# Patient Record
Sex: Male | Born: 1975 | Race: White | Hispanic: No | Smoking: Current some day smoker
Health system: Southern US, Community
[De-identification: ages and names within clinical notes are randomized; demographics above are authoritative.]

## PROBLEM LIST (undated history)

## (undated) DIAGNOSIS — Z98811 Dental restoration status: Secondary | ICD-10-CM

## (undated) DIAGNOSIS — S86019A Strain of unspecified Achilles tendon, initial encounter: Secondary | ICD-10-CM

## (undated) HISTORY — PX: NO PAST SURGERIES: SHX2092

---

## 2016-09-20 DIAGNOSIS — S86019A Strain of unspecified Achilles tendon, initial encounter: Secondary | ICD-10-CM

## 2016-09-20 HISTORY — DX: Strain of unspecified achilles tendon, initial encounter: S86.019A

## 2016-10-17 ENCOUNTER — Emergency Department (HOSPITAL_COMMUNITY): Admission: EM | Admit: 2016-10-17 | Discharge: 2016-10-17 | Discharge status: Against Medical Advice | Payer: Self-pay

## 2016-10-17 ENCOUNTER — Emergency Department (HOSPITAL_BASED_OUTPATIENT_CLINIC_OR_DEPARTMENT_OTHER)
Admission: EM | Admit: 2016-10-17 | Discharge: 2016-10-17 | Disposition: A | Discharge status: Home / Self Care | Payer: 59 | Attending: Emergency Medicine | Admitting: Emergency Medicine

## 2016-10-17 ENCOUNTER — Emergency Department (HOSPITAL_BASED_OUTPATIENT_CLINIC_OR_DEPARTMENT_OTHER): Payer: 59

## 2016-10-17 ENCOUNTER — Encounter (HOSPITAL_BASED_OUTPATIENT_CLINIC_OR_DEPARTMENT_OTHER): Payer: Self-pay | Admitting: *Deleted

## 2016-10-17 DIAGNOSIS — S86011A Strain of right Achilles tendon, initial encounter: Secondary | ICD-10-CM

## 2016-10-17 DIAGNOSIS — F17200 Nicotine dependence, unspecified, uncomplicated: Secondary | ICD-10-CM | POA: Diagnosis not present

## 2016-10-17 DIAGNOSIS — X500XXA Overexertion from strenuous movement or load, initial encounter: Secondary | ICD-10-CM | POA: Insufficient documentation

## 2016-10-17 DIAGNOSIS — Y92322 Soccer field as the place of occurrence of the external cause: Secondary | ICD-10-CM | POA: Diagnosis not present

## 2016-10-17 DIAGNOSIS — Y999 Unspecified external cause status: Secondary | ICD-10-CM | POA: Insufficient documentation

## 2016-10-17 DIAGNOSIS — S86021A Laceration of right Achilles tendon, initial encounter: Secondary | ICD-10-CM | POA: Insufficient documentation

## 2016-10-17 DIAGNOSIS — S8991XA Unspecified injury of right lower leg, initial encounter: Secondary | ICD-10-CM | POA: Diagnosis present

## 2016-10-17 DIAGNOSIS — Y9366 Activity, soccer: Secondary | ICD-10-CM | POA: Insufficient documentation

## 2016-10-17 MED ORDER — TRAMADOL HCL 50 MG PO TABS: 50.0000 mg | ORAL_TABLET | Freq: Four times a day (QID) | ORAL | 0 refills | Status: DC | PRN

## 2016-10-17 MED ORDER — HYDROCODONE-ACETAMINOPHEN 5-325 MG PO TABS
1.0000 | ORAL_TABLET | Freq: Once | ORAL | Status: AC
  Administered 2016-10-17: 1 via ORAL
  Filled 2016-10-17: qty 1

## 2016-10-17 NOTE — ED Provider Notes (Signed)
[facility] Provider Note   CSN: [id number] Arrival date & time: 10/17/16  2031     History   Chief Complaint No chief complaint on file.   HPI Jeremy Nielsen is a 41 y.o. male who presents emergency department today for right lower leg pain. Patient was playing soccer when beginning to sprint he felt a pop in the back of his right heel stating "it felt like somebody hit me in the back of the leg". He noted immediate pain to the area with difficulty with ambulation and movement of the ankle. Not taking anything for this. Nothing makes this better. No prior trauma or injury to the ankle in past. He denies numbness, tingling of the lower extremity.  HPI  History reviewed. No pertinent past medical history.  There are no active problems to display for this patient.   History reviewed. No pertinent surgical history.     Home Medications    Prior to Admission medications   Medication Sig Start Date End Date Taking? Authorizing Provider  traMADol (ULTRAM) 50 MG tablet Take 1 tablet (50 mg total) by mouth every 6 (six) hours as needed. 10/17/16   Drayden Lukas, Elmer Sow, PA-C    Family History No family history on file.  Social History Social History  Substance Use Topics  . Smoking status: Current Some Day Smoker  . Smokeless tobacco: Never Used  . Alcohol use Yes     Allergies   Patient has no known allergies.   Review of Systems Review of Systems  Musculoskeletal: Positive for arthralgias and myalgias. Negative for joint swelling.  Skin: Negative for wound.  Neurological: Negative for weakness and numbness.     Physical Exam Updated Vital Signs BP (!) 131/98   Pulse 82   Temp 98.4 F (36.9 C) (Oral)   Resp 20   Ht 5\' 6"  (1.676 m)   Wt 86.2 kg (190 lb)   SpO2 96%   BMI 30.67 kg/m   Physical Exam  Constitutional: He appears well-developed and well-nourished.  HENT:  Head: Normocephalic and atraumatic.  Right Ear: External ear normal.    Left Ear: External ear normal.  Eyes: Conjunctivae are normal. Right eye exhibits no discharge. Left eye exhibits no discharge. No scleral icterus.  Pulmonary/Chest: Effort normal. No respiratory distress.  Musculoskeletal:       Right knee: Normal. He exhibits normal range of motion. No tenderness found.       Right ankle: Decreased range of motion: intact passive range of motion. No tenderness. Achilles tendon exhibits pain, defect and abnormal Thompson's test results.       Right lower leg: He exhibits no tenderness.  Palpable defect of the Achilles tendon approximately 4 cm proximal to the calcaneus. Neurovascularly intact distally. Compartments soft   Neurological: He is alert.  Skin: No pallor.  Psychiatric: He has a normal mood and affect.  Nursing note and vitals reviewed.   ED Treatments / Results  Labs (all labs ordered are listed, but only abnormal results are displayed) Labs Reviewed - No data to display  EKG  EKG Interpretation None       Radiology Dg Ankle Complete Right  Result Date: 10/17/2016 CLINICAL DATA:  Pain after soccer injury while running today. EXAM: RIGHT ANKLE - COMPLETE 3+ VIEW COMPARISON:  None. FINDINGS: Negative for acute fracture or dislocation. There is soft tissue thickening in the Achilles region. There is mild ossification in the region of the tibiofibular syndesmosis which likely represents residua of remote  trauma. There also is a chronic appearing fragment at the medial malleolar tip. The mortise is symmetric. IMPRESSION: Negative for acute fracture. Mild soft tissue swelling in the Achilles region. Electronically Signed   By: Ellery Plunk M.D.   On: 10/17/2016 21:24    Procedures Procedures (including critical care time)  Medications Ordered in ED Medications  HYDROcodone-acetaminophen (NORCO/VICODIN) 5-325 MG per tablet 1 tablet (1 tablet Oral Given 10/17/16 2223)     Initial Impression / Assessment and Plan / ED Course  I have  reviewed the triage vital signs and the nursing notes.  Pertinent labs & imaging results that were available during my care of the patient were reviewed by me and considered in my medical decision making (see chart for details).     41 year old male with Achilles tendon rupture on exam. Patient is with a normal Thompson test and a palpable defect of the Achilles tendon approximately 4 cm proximal to the calcaneus. On x-ray there is noted mild tissue swelling in the Achilles region. No fracture. Patient will be placed into a splint and given crutches. Patient is to follow with orthopedics to discuss further treatment for this. Advised RICE therapy. Short course of pain medication given.  Patient reviewed in [place] Controlled Substance Reporting System. Pain managed in the department. Specific return precautions discussed. The patient verbalized understanding and agreement with plan. All questions answered. No further questions at this time. The patient is hemodynamically stable, mentating appropriately and appears safe for discharge.  Final Clinical Impressions(s) / ED Diagnoses   Final diagnoses:  Rupture of right Achilles tendon, initial encounter    New Prescriptions Discharge Medication List as of 10/17/2016 10:39 PM    START taking these medications   Details  traMADol (ULTRAM) 50 MG tablet Take 1 tablet (50 mg total) by mouth every 6 (six) hours as needed., Starting Tue 10/17/2016, Print         Jacinto Halim, PA-C 10/18/16 0143    Melene Plan, DO 10/18/16 1747

## 2016-10-17 NOTE — Discharge Instructions (Signed)
You have been diagnosed with an Achilles tendon rupture today. I provided you with a splint and crutches. Please remain nonweightbearing until you follow with orthopedics to discuss if surgical treatment is needed for this. Please call and schedule an appointment with the orthopedist. For pain control you may take:  800mg  of ibuprofen (that is usually 4 over the counter pills)  3 times a day (take with food) and acetaminophen 975mg  (this is 3 over the counter pills) four times a day. Do not drink alcohol or combine with other medications that have acetaminophen as an ingredient (Read the labels!).  For breakthrough pain you may take tramadol. Do not drink alcohol drive or operate heavy machinery when taking tramadol. If you develop worsening or new concerning symptoms you can return to the emergency department for re-evaluation.

## 2016-10-17 NOTE — ED Triage Notes (Signed)
Right leg pain. He was playing soccer and felt a snap while running. Swelling to his heel.

## 2016-10-18 ENCOUNTER — Encounter (INDEPENDENT_AMBULATORY_CARE_PROVIDER_SITE_OTHER): Payer: Self-pay | Admitting: Orthopaedic Surgery

## 2016-10-18 ENCOUNTER — Ambulatory Visit (INDEPENDENT_AMBULATORY_CARE_PROVIDER_SITE_OTHER): Payer: 59 | Admitting: Orthopaedic Surgery

## 2016-10-18 VITALS — BP 122/76 | HR 75 | Ht 66.0 in | Wt 190.0 lb

## 2016-10-18 DIAGNOSIS — S86011A Strain of right Achilles tendon, initial encounter: Secondary | ICD-10-CM

## 2016-10-18 NOTE — Progress Notes (Signed)
Office Visit Note   Patient: Jeremy HongSteve Schoen           Date of Birth: 03/03/1975           MRN: [id number] Visit Date: 10/18/2016              Requested by: No referring provider defined for this encounter. PCP: Eather ColasHunter, Megan A, FNP   Assessment & Plan: Visit Diagnoses:  1. Achilles tendon rupture, right, initial encounter     Plan: We discussed options he has complete Achilles tendon rupture with gap in the tendon. He like proceed with operative repair. We discussed outpatient surgery. His healthy without medical problems. Work slip given for no work 7-12 weeks. If he can do a sit down job at his desk he should really get back to work once his cast is applied 1 week postop and is off his pain medication that he will need short-term after the surgery.  Follow-Up Instructions: Follow-up 1 week after surgery.  Orders:  No orders of the defined types were placed in this encounter.  No orders of the defined types were placed in this encounter.     Procedures: No procedures performed   Clinical Data: No additional findings.   Subjective: Chief Complaint  Patient presents with  . Right Ankle - Pain    HPI 41 year old male was playing soccer and felt a sudden pop when he was running. He use  to play actively but had not played in about 5 years. Pain was immediately in the back of the calf is not able to walk. He was seen in emergency room positive Thompson test with palpable gap and Achilles tendon with complete Achilles tendon rupture. He is on crutches nonweightbearing is in a splint. He is here with his ex-wife.  Review of Systems patient is healthy on no medications. He's been physically active. 2 years ago he had a toe fracture. 14 point review of systems otherwise negative as it pertains his history of present illness.   Objective: Vital Signs: BP 122/76   Pulse 75   Ht 5\' 6"  (1.676 m)   Wt 190 lb (86.2 kg)   BMI 30.67 kg/m   Physical Exam  Constitutional: He  is oriented to person, place, and time. He appears well-developed and well-nourished.  HENT:  Head: Normocephalic and atraumatic.  Eyes: Pupils are equal, round, and reactive to light. EOM are normal.  Neck: No tracheal deviation present. No thyromegaly present.  Cardiovascular: Normal rate.   Pulmonary/Chest: Effort normal. He has no wheezes.  Abdominal: Soft. Bowel sounds are normal.  Musculoskeletal:  Splint is removed skin Achilles tendon is normal his palpable defect 2 cm with a positive Thompson test. No skin blisters. Distal pulses are intact sensation of the foot is intact.   Neurological: He is alert and oriented to person, place, and time.  Skin: Skin is warm and dry. Capillary refill takes less than 2 seconds.  Psychiatric: He has a normal mood and affect. His behavior is normal. Judgment and thought content normal.    Ortho Exam  Specialty Comments:  No specialty comments available.  Imaging: Dg Ankle Complete Right  Result Date: 10/17/2016 CLINICAL DATA:  Pain after soccer injury while running today. EXAM: RIGHT ANKLE - COMPLETE 3+ VIEW COMPARISON:  None. FINDINGS: Negative for acute fracture or dislocation. There is soft tissue thickening in the Achilles region. There is mild ossification in the region of the tibiofibular syndesmosis which likely represents residua of remote trauma. There  also is a chronic appearing fragment at the medial malleolar tip. The mortise is symmetric. IMPRESSION: Negative for acute fracture. Mild soft tissue swelling in the Achilles region. Electronically Signed   By: Ellery Plunk M.D.   On: 10/17/2016 21:24     PMFS History: There are no active problems to display for this patient.  No past medical history on file.  No family history on file.  No past surgical history on file. Social History   Occupational History  . Not on file.   Social History Main Topics  . Smoking status: Current Some Day Smoker  . Smokeless tobacco: Never  Used  . Alcohol use Yes  . Drug use: No  . Sexual activity: Not on file

## 2016-10-19 ENCOUNTER — Encounter (HOSPITAL_BASED_OUTPATIENT_CLINIC_OR_DEPARTMENT_OTHER): Payer: Self-pay | Admitting: *Deleted

## 2016-10-25 ENCOUNTER — Ambulatory Visit (HOSPITAL_BASED_OUTPATIENT_CLINIC_OR_DEPARTMENT_OTHER)
Admission: RE | Admit: 2016-10-25 | Discharge: 2016-10-25 | Disposition: A | Discharge status: Home / Self Care | Payer: 59 | Source: Ambulatory Visit | Attending: Orthopaedic Surgery | Admitting: Orthopaedic Surgery

## 2016-10-25 ENCOUNTER — Ambulatory Visit (HOSPITAL_BASED_OUTPATIENT_CLINIC_OR_DEPARTMENT_OTHER): Payer: 59 | Admitting: Anesthesiology

## 2016-10-25 ENCOUNTER — Encounter (HOSPITAL_BASED_OUTPATIENT_CLINIC_OR_DEPARTMENT_OTHER)
Admission: RE | Disposition: A | Discharge status: Home / Self Care | Payer: Self-pay | Source: Ambulatory Visit | Attending: Orthopaedic Surgery

## 2016-10-25 ENCOUNTER — Encounter (HOSPITAL_BASED_OUTPATIENT_CLINIC_OR_DEPARTMENT_OTHER): Payer: Self-pay | Admitting: Anesthesiology

## 2016-10-25 DIAGNOSIS — Z87891 Personal history of nicotine dependence: Secondary | ICD-10-CM | POA: Diagnosis not present

## 2016-10-25 DIAGNOSIS — S86011A Strain of right Achilles tendon, initial encounter: Secondary | ICD-10-CM | POA: Diagnosis not present

## 2016-10-25 DIAGNOSIS — X58XXXA Exposure to other specified factors, initial encounter: Secondary | ICD-10-CM | POA: Diagnosis not present

## 2016-10-25 DIAGNOSIS — S86019A Strain of unspecified Achilles tendon, initial encounter: Secondary | ICD-10-CM

## 2016-10-25 HISTORY — DX: Strain of unspecified achilles tendon, initial encounter: S86.019A

## 2016-10-25 HISTORY — DX: Dental restoration status: Z98.811

## 2016-10-25 HISTORY — PX: ACHILLES TENDON SURGERY: SHX542

## 2016-10-25 SURGERY — REPAIR, TENDON, ACHILLES
Anesthesia: Regional | Site: Leg Lower | Laterality: Right

## 2016-10-25 MED ORDER — BUPIVACAINE-EPINEPHRINE (PF) 0.5% -1:200000 IJ SOLN
INTRAMUSCULAR | Status: DC | PRN
  Administered 2016-10-25: 30 mL via PERINEURAL

## 2016-10-25 MED ORDER — DEXAMETHASONE SODIUM PHOSPHATE 4 MG/ML IJ SOLN
INTRAMUSCULAR | Status: DC | PRN
  Administered 2016-10-25: 10 mg via INTRAVENOUS

## 2016-10-25 MED ORDER — LIDOCAINE HCL (CARDIAC) 20 MG/ML IV SOLN
INTRAVENOUS | Status: DC | PRN
  Administered 2016-10-25: 100 mg via INTRAVENOUS

## 2016-10-25 MED ORDER — FENTANYL CITRATE (PF) 100 MCG/2ML IJ SOLN
50.0000 ug | INTRAMUSCULAR | Status: DC | PRN
  Administered 2016-10-25: 100 ug via INTRAVENOUS

## 2016-10-25 MED ORDER — KETOROLAC TROMETHAMINE 30 MG/ML IJ SOLN: 30.0000 mg | Freq: Once | INTRAMUSCULAR | Status: DC | PRN

## 2016-10-25 MED ORDER — DEXAMETHASONE SODIUM PHOSPHATE 10 MG/ML IJ SOLN
INTRAMUSCULAR | Status: AC
  Filled 2016-10-25: qty 1

## 2016-10-25 MED ORDER — PROPOFOL 10 MG/ML IV BOLUS
INTRAVENOUS | Status: DC | PRN
  Administered 2016-10-25: 200 mg via INTRAVENOUS

## 2016-10-25 MED ORDER — ONDANSETRON HCL 4 MG/2ML IJ SOLN
INTRAMUSCULAR | Status: AC
  Filled 2016-10-25: qty 2

## 2016-10-25 MED ORDER — ONDANSETRON HCL 4 MG/2ML IJ SOLN
INTRAMUSCULAR | Status: DC | PRN
  Administered 2016-10-25: 4 mg via INTRAVENOUS

## 2016-10-25 MED ORDER — OXYCODONE HCL 5 MG/5ML PO SOLN: 5.0000 mg | Freq: Once | ORAL | Status: DC | PRN

## 2016-10-25 MED ORDER — PROMETHAZINE HCL 25 MG/ML IJ SOLN: 6.2500 mg | INTRAMUSCULAR | Status: DC | PRN

## 2016-10-25 MED ORDER — OXYCODONE HCL 5 MG PO TABS: 5.0000 mg | ORAL_TABLET | Freq: Once | ORAL | Status: DC | PRN

## 2016-10-25 MED ORDER — FENTANYL CITRATE (PF) 100 MCG/2ML IJ SOLN
INTRAMUSCULAR | Status: AC
  Filled 2016-10-25: qty 2

## 2016-10-25 MED ORDER — SUCCINYLCHOLINE CHLORIDE 200 MG/10ML IV SOSY
PREFILLED_SYRINGE | INTRAVENOUS | Status: AC
  Filled 2016-10-25: qty 10

## 2016-10-25 MED ORDER — SUGAMMADEX SODIUM 200 MG/2ML IV SOLN
INTRAVENOUS | Status: DC | PRN
  Administered 2016-10-25: 200 mg via INTRAVENOUS

## 2016-10-25 MED ORDER — SUGAMMADEX SODIUM 200 MG/2ML IV SOLN: INTRAVENOUS | Status: DC | PRN

## 2016-10-25 MED ORDER — OXYCODONE-ACETAMINOPHEN 5-325 MG PO TABS: 1.0000 | ORAL_TABLET | ORAL | 0 refills | Status: DC | PRN

## 2016-10-25 MED ORDER — CEFAZOLIN SODIUM-DEXTROSE 2-4 GM/100ML-% IV SOLN
INTRAVENOUS | Status: AC
  Filled 2016-10-25: qty 100

## 2016-10-25 MED ORDER — LACTATED RINGERS IV SOLN
INTRAVENOUS | Status: DC
  Administered 2016-10-25 (×2): via INTRAVENOUS

## 2016-10-25 MED ORDER — MEPERIDINE HCL 25 MG/ML IJ SOLN: 6.2500 mg | INTRAMUSCULAR | Status: DC | PRN

## 2016-10-25 MED ORDER — CHLORHEXIDINE GLUCONATE 4 % EX LIQD: 60.0000 mL | Freq: Once | CUTANEOUS | Status: DC

## 2016-10-25 MED ORDER — MIDAZOLAM HCL 2 MG/2ML IJ SOLN
INTRAMUSCULAR | Status: AC
  Filled 2016-10-25: qty 2

## 2016-10-25 MED ORDER — PROPOFOL 10 MG/ML IV BOLUS
INTRAVENOUS | Status: AC
  Filled 2016-10-25: qty 20

## 2016-10-25 MED ORDER — MIDAZOLAM HCL 2 MG/2ML IJ SOLN
1.0000 mg | INTRAMUSCULAR | Status: DC | PRN
  Administered 2016-10-25 (×2): 2 mg via INTRAVENOUS

## 2016-10-25 MED ORDER — FENTANYL CITRATE (PF) 100 MCG/2ML IJ SOLN: 25.0000 ug | INTRAMUSCULAR | Status: DC | PRN

## 2016-10-25 MED ORDER — CEFAZOLIN SODIUM-DEXTROSE 2-4 GM/100ML-% IV SOLN
2.0000 g | INTRAVENOUS | Status: AC
  Administered 2016-10-25: 2 g via INTRAVENOUS

## 2016-10-25 MED ORDER — ROCURONIUM BROMIDE 100 MG/10ML IV SOLN
INTRAVENOUS | Status: DC | PRN
  Administered 2016-10-25: 50 mg via INTRAVENOUS

## 2016-10-25 MED ORDER — ONDANSETRON HCL 4 MG/2ML IJ SOLN
INTRAMUSCULAR | Status: AC
Start: 2016-10-25 — End: 2016-10-25
  Filled 2016-10-25: qty 2

## 2016-10-25 MED ORDER — LIDOCAINE 2% (20 MG/ML) 5 ML SYRINGE
INTRAMUSCULAR | Status: AC
  Filled 2016-10-25: qty 5

## 2016-10-25 MED ORDER — SCOPOLAMINE 1 MG/3DAYS TD PT72: 1.0000 | MEDICATED_PATCH | Freq: Once | TRANSDERMAL | Status: DC | PRN

## 2016-10-25 SURGICAL SUPPLY — 48 items
BAG DECANTER FOR FLEXI CONT (MISCELLANEOUS) IMPLANT
BANDAGE ACE 6X5 VEL STRL LF (GAUZE/BANDAGES/DRESSINGS) ×2 IMPLANT
BANDAGE ESMARK 6X9 LF (GAUZE/BANDAGES/DRESSINGS) ×1 IMPLANT
BENZOIN TINCTURE PRP APPL 2/3 (GAUZE/BANDAGES/DRESSINGS) IMPLANT
BLADE SURG 15 STRL LF DISP TIS (BLADE) ×1 IMPLANT
BLADE SURG 15 STRL SS (BLADE) ×1
BNDG ESMARK 6X9 LF (GAUZE/BANDAGES/DRESSINGS) ×2
CANISTER SUCT 1200ML W/VALVE (MISCELLANEOUS) ×2 IMPLANT
DRAPE EXTREMITY T 121X128X90 (DRAPE) ×2 IMPLANT
DRAPE U-SHAPE 47X51 STRL (DRAPES) ×2 IMPLANT
DURAPREP 26ML APPLICATOR (WOUND CARE) ×2 IMPLANT
ELECT REM PT RETURN 9FT ADLT (ELECTROSURGICAL) ×2
ELECTRODE REM PT RTRN 9FT ADLT (ELECTROSURGICAL) ×1 IMPLANT
GAUZE XEROFORM 1X8 LF (GAUZE/BANDAGES/DRESSINGS) ×2 IMPLANT
GLOVE BIO SURGEON STRL SZ7.5 (GLOVE) ×2 IMPLANT
GLOVE BIOGEL PI IND STRL 7.0 (GLOVE) ×1 IMPLANT
GLOVE BIOGEL PI IND STRL 8 (GLOVE) ×3 IMPLANT
GLOVE BIOGEL PI INDICATOR 7.0 (GLOVE) ×1
GLOVE BIOGEL PI INDICATOR 8 (GLOVE) ×3
GLOVE ORTHO TXT STRL SZ7.5 (GLOVE) ×2 IMPLANT
GLOVE SURG SS PI 7.0 STRL IVOR (GLOVE) ×2 IMPLANT
GOWN STRL REUS W/ TWL LRG LVL3 (GOWN DISPOSABLE) ×1 IMPLANT
GOWN STRL REUS W/ TWL XL LVL3 (GOWN DISPOSABLE) ×1 IMPLANT
GOWN STRL REUS W/TWL LRG LVL3 (GOWN DISPOSABLE) ×1
GOWN STRL REUS W/TWL XL LVL3 (GOWN DISPOSABLE) ×1
NS IRRIG 1000ML POUR BTL (IV SOLUTION) ×2 IMPLANT
PACK ARTHROSCOPY DSU (CUSTOM PROCEDURE TRAY) ×2 IMPLANT
PACK BASIN DAY SURGERY FS (CUSTOM PROCEDURE TRAY) ×2 IMPLANT
PAD CAST 4YDX4 CTTN HI CHSV (CAST SUPPLIES) ×1 IMPLANT
PADDING CAST COTTON 4X4 STRL (CAST SUPPLIES) ×1
PENCIL BUTTON HOLSTER BLD 10FT (ELECTRODE) ×2 IMPLANT
SPLINT FIBERGLASS 4X30 (CAST SUPPLIES) ×4 IMPLANT
SPONGE LAP 4X18 X RAY DECT (DISPOSABLE) ×2 IMPLANT
STAPLER VISISTAT 35W (STAPLE) ×2 IMPLANT
STOCKINETTE 6  STRL (DRAPES) ×1
STOCKINETTE 6 STRL (DRAPES) ×1 IMPLANT
STRIP CLOSURE SKIN 1/2X4 (GAUZE/BANDAGES/DRESSINGS) IMPLANT
SUCTION FRAZIER HANDLE 10FR (MISCELLANEOUS)
SUCTION TUBE FRAZIER 10FR DISP (MISCELLANEOUS) IMPLANT
SUT ETHILON 4 0 PC 5 (SUTURE) IMPLANT
SUT FIBERWIRE #2 38 T-5 BLUE (SUTURE)
SUT VIC AB 0 CT1 27 (SUTURE) ×3
SUT VIC AB 0 CT1 27XBRD ANBCTR (SUTURE) ×3 IMPLANT
SUT VIC AB 2-0 PS2 27 (SUTURE) ×6 IMPLANT
SUTURE FIBERWR #2 38 T-5 BLUE (SUTURE) IMPLANT
SYR BULB 3OZ (MISCELLANEOUS) ×2 IMPLANT
UNDERPAD 30X30 (UNDERPADS AND DIAPERS) ×2 IMPLANT
YANKAUER SUCT BULB TIP NO VENT (SUCTIONS) ×2 IMPLANT

## 2016-10-25 NOTE — Anesthesia Preprocedure Evaluation (Signed)
Anesthesia Evaluation  Patient identified by MRN, date of birth, ID band Patient awake    Reviewed: Allergy & Precautions, NPO status , Patient's Chart, lab work & pertinent test results  Airway Mallampati: II  TM Distance: >3 FB Neck ROM: Full    Dental no notable dental hx.    Pulmonary neg pulmonary ROS, former smoker,    Pulmonary exam normal breath sounds clear to auscultation       Cardiovascular negative cardio ROS Normal cardiovascular exam Rhythm:Regular Rate:Normal     Neuro/Psych negative neurological ROS  negative psych ROS   GI/Hepatic negative GI ROS, Neg liver ROS,   Endo/Other  negative endocrine ROS  Renal/GU negative Renal ROS     Musculoskeletal negative musculoskeletal ROS (+)   Abdominal   Peds  Hematology negative hematology ROS (+)   Anesthesia Other Findings   Reproductive/Obstetrics                             Anesthesia Physical Anesthesia Plan  ASA: II  Anesthesia Plan: General and Regional   Post-op Pain Management:    Induction: Intravenous  PONV Risk Score and Plan: 2 and Ondansetron, Dexamethasone and Midazolam  Airway Management Planned: LMA  Additional Equipment:   Intra-op Plan:   Post-operative Plan: Extubation in OR  Informed Consent: I have reviewed the patients History and Physical, chart, labs and discussed the procedure including the risks, benefits and alternatives for the proposed anesthesia with the patient or authorized representative who has indicated his/her understanding and acceptance.   Dental advisory given  Plan Discussed with: CRNA  Anesthesia Plan Comments:         Anesthesia Quick Evaluation

## 2016-10-25 NOTE — Anesthesia Procedure Notes (Signed)
Anesthesia Regional Block: Popliteal block   Pre-Anesthetic Checklist: ,, timeout performed, Correct Patient, Correct Site, Correct Laterality, Correct Procedure, Correct Position, site marked, Risks and benefits discussed,  Surgical consent,  Pre-op evaluation,  At surgeon's request and post-op pain management  Laterality: Right  Prep: chloraprep       Needles:  Injection technique: Single-shot  Needle Type: Stimiplex     Needle Length: 10cm  Needle Gauge: 21     Additional Needles:   Procedures: ultrasound guided,,,,,,,,  Motor weakness within 5 minutes.   Nerve Stimulator or Paresthesia:  Response: Plantar flexion/toe flexion, 0.5 mA,   Additional Responses:   Narrative:  Start time: 10/25/2016 11:54 AM End time: 10/25/2016 11:59 AM Injection made incrementally with aspirations every 5 mL.  Performed by: Personally  Anesthesiologist: Lewie LoronGERMEROTH, Kaiyla Stahly  Additional Notes: Nerve located and needle positioned with direct ultrasound guidance. Good perineural spread. Patient tolerated well.

## 2016-10-25 NOTE — Progress Notes (Signed)
Assisted Dr. Germeroth with right, ultrasound guided, popliteal block. Side rails up, monitors on throughout procedure. See vital signs in flow sheet. Tolerated Procedure well. 

## 2016-10-25 NOTE — Brief Op Note (Signed)
10/25/2016  1:11 PM  PATIENT:  Jeremy Nielsen  41 y.o. male  PRE-OPERATIVE DIAGNOSIS:  Complete Right Achilles Tendon Rupture  POST-OPERATIVE DIAGNOSIS:  Complete Right Achilles Tendon Rupture  PROCEDURE:  Procedure(s) with comments: RIGHT ACHILLES TENDON REPAIR (Right) - Achilles Tendon  SURGEON:  Surgeon(s) and Role:    * Eldred MangesYates, Dalena Plantz C, MD - Primary  PHYSICIAN ASSISTANT: Zonia KiefJames Owens PA-C  ASSISTANTS: none   ANESTHESIA:   regional and general  EBL:  Total I/O In: 400 [I.V.:400] Out: -   BLOOD ADMINISTERED:none  DRAINS: none   LOCAL MEDICATIONS USED:  NONE  SPECIMEN:  No Specimen  DISPOSITION OF SPECIMEN:  N/A  COUNTS:  YES  TOURNIQUET:  * Missing tourniquet times found for documented tourniquets in log:  [id number] *  DICTATION: .Dragon Dictation  PLAN OF CARE: Discharge to home after PACU  PATIENT DISPOSITION:  PACU - hemodynamically stable.   Delay start of Pharmacological VTE agent (>24hrs) due to surgical blood loss or risk of bleeding: not applicable

## 2016-10-25 NOTE — Transfer of Care (Signed)
Immediate Anesthesia Transfer of Care Note  Patient: Irean HongSteve Seal  Procedure(s) Performed: Procedure(s) with comments: RIGHT ACHILLES TENDON REPAIR (Right) - Achilles Tendon  Patient Location: PACU  Anesthesia Type:GA combined with regional for post-op pain  Level of Consciousness: sedated  Airway & Oxygen Therapy: Patient Spontanous Breathing and Patient connected to face mask oxygen  Post-op Assessment: Report given to RN and Post -op Vital signs reviewed and stable  Post vital signs: Reviewed and stable  Last Vitals:  Vitals:   10/25/16 1157 10/25/16 1200  BP:  112/88  Pulse: 67 69  Resp:    Temp:    SpO2: 100% 96%    Last Pain:  Vitals:   10/25/16 1029  TempSrc: Oral  PainSc: 3       Patients Stated Pain Goal: 3 (10/25/16 1029)  Complications: No apparent anesthesia complications

## 2016-10-25 NOTE — H&P (Signed)
Jeremy HongSteve Nielsen is an 41 y.o. male.   Chief Complaint: Right Achilles tendon tear HPI: Patient with a right ankle pain and the above complaint presents for surgical intervention.  Past Medical History:  Diagnosis Date  . Achilles tendon rupture 09/2016   right  . Dental crowns present     Past Surgical History:  Procedure Laterality Date  . NO PAST SURGERIES      History reviewed. No pertinent family history. Social History:  reports that he quit smoking about 4 years ago. He has never used smokeless tobacco. He reports that he drinks alcohol. He reports that he does not use drugs.  Allergies: No Known Allergies  Medications Prior to Admission  Medication Sig Dispense Refill  . ibuprofen (ADVIL,MOTRIN) 600 MG tablet Take 600 mg by mouth every 6 (six) hours as needed.    . traMADol (ULTRAM) 50 MG tablet Take 1 tablet (50 mg total) by mouth every 6 (six) hours as needed. 10 tablet 0    No results found for this or any previous visit (from the past 48 hour(s)). No results found.  Review of Systems  Constitutional: Negative.   HENT: Negative.   Respiratory: Negative.   Cardiovascular: Negative.   Gastrointestinal: Negative.   Genitourinary: Negative.   Musculoskeletal: Positive for joint pain.  Skin: Negative.   Psychiatric/Behavioral: Negative.     Blood pressure 112/88, pulse 69, temperature 98.9 F (37.2 C), temperature source Oral, resp. rate 20, height 5\' 7"  (1.702 m), weight 190 lb (86.2 kg), SpO2 96 %. Physical Exam  Constitutional: He is oriented to person, place, and time. No distress.  HENT:  Head: Normocephalic and atraumatic.  Eyes: Pupils are equal, round, and reactive to light. EOM are normal.  Respiratory: No respiratory distress.  GI: He exhibits no distension.  Neurological: He is alert and oriented to person, place, and time.  Achilles tendon tender with palpable defect  Skin: Skin is warm and dry.  Psychiatric: He has a normal mood and affect.     Assessment/Plan Right Achilles tendon tear  We'll proceed with right Achilles tendon repair as scheduled. Surgical procedure along with possible rehabilitation/recovery time discussed. All questions answered.  Zonia KiefJames Eleftheria Taborn, PA-C 10/25/2016, 12:17 PM

## 2016-10-25 NOTE — Anesthesia Procedure Notes (Signed)
Procedure Name: Intubation Date/Time: 10/25/2016 12:38 PM Performed by: Maryella Shivers Pre-anesthesia Checklist: Patient identified, Emergency Drugs available, Suction available and Patient being monitored Patient Re-evaluated:Patient Re-evaluated prior to induction Oxygen Delivery Method: Circle system utilized Preoxygenation: Pre-oxygenation with 100% oxygen Induction Type: IV induction Ventilation: Mask ventilation without difficulty Laryngoscope Size: Mac and 3 Grade View: Grade I Tube type: Oral Tube size: 8.0 mm Number of attempts: 1 Airway Equipment and Method: Stylet and Oral airway Placement Confirmation: ETT inserted through vocal cords under direct vision,  positive ETCO2 and breath sounds checked- equal and bilateral Secured at: 22 cm Tube secured with: Tape Dental Injury: Teeth and Oropharynx as per pre-operative assessment

## 2016-10-25 NOTE — Discharge Instructions (Signed)
Elevate leg, keep splint dry. See Dr. Ophelia CharterYates in 2 wks.  Post Anesthesia Home Care Instructions  Activity: Get plenty of rest for the remainder of the day. A responsible individual must stay with you for 24 hours following the procedure.  For the next 24 hours, DO NOT: -Drive a car -Advertising copywriterperate machinery -Drink alcoholic beverages -Take any medication unless instructed by your physician -Make any legal decisions or sign important papers.  Meals: Start with liquid foods such as gelatin or soup. Progress to regular foods as tolerated. Avoid greasy, spicy, heavy foods. If nausea and/or vomiting occur, drink only clear liquids until the nausea and/or vomiting subsides. Call your physician if vomiting continues.  Special Instructions/Symptoms: Your throat may feel dry or sore from the anesthesia or the breathing tube placed in your throat during surgery. If this causes discomfort, gargle with warm salt water. The discomfort should disappear within 24 hours.  If you had a scopolamine patch placed behind your ear for the management of post- operative nausea and/or vomiting:  1. The medication in the patch is effective for 72 hours, after which it should be removed.  Wrap patch in a tissue and discard in the trash. Wash hands thoroughly with soap and water. 2. You may remove the patch earlier than 72 hours if you experience unpleasant side effects which may include dry mouth, dizziness or visual disturbances. 3. Avoid touching the patch. Wash your hands with soap and water after contact with the patch.   Regional Anesthesia Blocks  1. Numbness or the inability to move the "blocked" extremity may last from 3-48 hours after placement. The length of time depends on the medication injected and your individual response to the medication. If the numbness is not going away after 48 hours, call your surgeon.  2. The extremity that is blocked will need to be protected until the numbness is gone and the   Strength has returned. Because you cannot feel it, you will need to take extra care to avoid injury. Because it may be weak, you may have difficulty moving it or using it. You may not know what position it is in without looking at it while the block is in effect.  3. For blocks in the legs and feet, returning to weight bearing and walking needs to be done carefully. You will need to wait until the numbness is entirely gone and the strength has returned. You should be able to move your leg and foot normally before you try and bear weight or walk. You will need someone to be with you when you first try to ensure you do not fall and possibly risk injury.  4. Bruising and tenderness at the needle site are common side effects and will resolve in a few days.  5. Persistent numbness or new problems with movement should be communicated to the surgeon or the [facility] [phone])/ [facility] [phone]).

## 2016-10-25 NOTE — Interval H&P Note (Signed)
History and Physical Interval Note:  10/25/2016 12:25 PM  Irean HongSteve Thaxton  has presented today for surgery, with the diagnosis of Complete Right Achilles Tendon Rupture  The various methods of treatment have been discussed with the patient and family. After consideration of risks, benefits and other options for treatment, the patient has consented to  Procedure(s): RIGHT ACHILLES TENDON REPAIR (Right) as a surgical intervention .  The patient's history has been reviewed, patient examined, no change in status, stable for surgery.  I have reviewed the patient's chart and labs.  Questions were answered to the patient's satisfaction.     Eldred MangesMark C Okley Magnussen

## 2016-10-25 NOTE — Op Note (Signed)
Preop diagnosis: Right complete Achilles tendon rupture  Postop diagnosis: Same  Procedure: Repair right Achilles tendon rupture  Surgeon: Annell GreeningMark Kale Dols M.D.  Assistant: Zonia KiefJames Owens PA-the present for the entire procedure.  Tourniquet: 350 pressure times less than 1 hour see anesthetic record  Procedure: After application of a proximal thigh tourniquet on the right calf bumpers on the left patient was flipped over on chest rolls after intubation careful padding positioning patient had preoperative block popliteal and then had general anesthesia with tube placement. In the prone position standard prepping was performed with DuraPrep up to the thigh. Show sheets and drapes stockinette was applied sterile skin marker Betadine Steri-Drape. Palpable defect was noted for 5 cm from the calcaneus with complete disruption of the Achilles tendon. After timeout procedure wrapping the leg with Esmarch tourniquet inflation at 350 incision was made along the posterior medial aspect of the patellar tendon. Patellar sheath was opened there is complete rupture noted. #2 FiberWire sutures were placed originally with a Bonelli followed by 2 Gina sutures multiple sutures medial and lateral as well as posterior for additional securement. Of tear was transverse shredded and had typical degenerative the Achilles tendon changes. Once additional sutures were placed sheath was reapproximated with 20 of Vicryl. 2-0 Vicryl subtendinous tissue skin subcutaneous closure postop dressing with the short leg splint application. Taste patient tolerated the procedure well and was transferred to the recovery room.

## 2016-10-26 ENCOUNTER — Encounter (HOSPITAL_BASED_OUTPATIENT_CLINIC_OR_DEPARTMENT_OTHER): Payer: Self-pay | Admitting: Orthopaedic Surgery

## 2016-10-27 NOTE — Anesthesia Postprocedure Evaluation (Signed)
Anesthesia Post Note  Patient: Jeremy HongSteve Nielsen  Procedure(s) Performed: Procedure(s) (LRB): RIGHT ACHILLES TENDON REPAIR (Right)     Patient location during evaluation: PACU Anesthesia Type: Regional and General Level of consciousness: sedated and patient cooperative Pain management: pain level controlled Vital Signs Assessment: post-procedure vital signs reviewed and stable Respiratory status: spontaneous breathing Cardiovascular status: stable Anesthetic complications: no    Last Vitals:  Vitals:   10/25/16 1415 10/25/16 1418  BP: 125/85 117/88  Pulse:  83  Resp:  17  Temp:  36.5 C  SpO2:  95%    Last Pain:  Vitals:   10/25/16 1418  TempSrc: Oral  PainSc: 0-No pain   Pain Goal: Patients Stated Pain Goal: 3 (10/25/16 1029)               [name]

## 2016-11-01 ENCOUNTER — Ambulatory Visit (INDEPENDENT_AMBULATORY_CARE_PROVIDER_SITE_OTHER): Payer: 59 | Admitting: Surgery

## 2016-11-08 ENCOUNTER — Encounter (INDEPENDENT_AMBULATORY_CARE_PROVIDER_SITE_OTHER): Payer: Self-pay | Admitting: Orthopaedic Surgery

## 2016-11-08 ENCOUNTER — Ambulatory Visit (INDEPENDENT_AMBULATORY_CARE_PROVIDER_SITE_OTHER): Payer: 59 | Admitting: Orthopaedic Surgery

## 2016-11-08 VITALS — BP 120/74 | HR 90

## 2016-11-08 DIAGNOSIS — S86011D Strain of right Achilles tendon, subsequent encounter: Secondary | ICD-10-CM | POA: Diagnosis not present

## 2016-11-08 NOTE — Progress Notes (Signed)
   Post-Op Visit Note   Patient: Jeremy Nielsen           Date of Birth: 1975/12/16           MRN: [id number] Visit Date: 11/08/2016 PCP: Eather Colas, FNP   Assessment & Plan: 41 year old male returns 2 weeks post right Achilles repair. Incision looks good appears intact. Staples harvested Steri-Strips shortly prior last cast applied. Return 5 weeks for cast off and likely cam boot with heel left  Hapad and therapy referral.  Chief Complaint:  Chief Complaint  Patient presents with  . Right Leg - Routine Post Op   Visit Diagnoses:  1. Rupture of right Achilles tendon, subsequent encounter     Plan: Staples out, short leg fiberglass cast. Office follow-up 5 weeks for cast off.  Follow-Up Instructions: No Follow-up on file.   Orders:  No orders of the defined types were placed in this encounter.  No orders of the defined types were placed in this encounter.   Imaging: No results found.  PMFS History: Patient Active Problem List   Diagnosis Date Noted  . Achilles tendon rupture 10/25/2016   Past Medical History:  Diagnosis Date  . Achilles tendon rupture 09/2016   right  . Dental crowns present     No family history on file.  Past Surgical History:  Procedure Laterality Date  . ACHILLES TENDON SURGERY Right 10/25/2016   Procedure: RIGHT ACHILLES TENDON REPAIR;  Surgeon: Eldred Manges, MD;  Location: [facility];  Service: Orthopedics;  Laterality: Right;  Achilles Tendon  . NO PAST SURGERIES     Social History   Occupational History  . Not on file.   Social History Main Topics  . Smoking status: Former Smoker    Quit date: 02/20/2012  . Smokeless tobacco: Never Used  . Alcohol use Yes     Comment: rarely  . Drug use: No  . Sexual activity: Not on file

## 2016-12-13 ENCOUNTER — Ambulatory Visit (INDEPENDENT_AMBULATORY_CARE_PROVIDER_SITE_OTHER): Payer: 59 | Admitting: Orthopaedic Surgery

## 2016-12-13 ENCOUNTER — Encounter (INDEPENDENT_AMBULATORY_CARE_PROVIDER_SITE_OTHER): Payer: Self-pay | Admitting: Orthopaedic Surgery

## 2016-12-13 VITALS — Ht 66.0 in | Wt 190.0 lb

## 2016-12-13 DIAGNOSIS — S86011D Strain of right Achilles tendon, subsequent encounter: Secondary | ICD-10-CM

## 2016-12-13 NOTE — Progress Notes (Signed)
   Post-Op Visit Note   Patient: Jeremy HongSteve Turri           Date of Birth: 12/07/1975           MRN: [id number] Visit Date: 12/13/2016 PCP: Eather ColasHunter, Megan A, FNP   Assessment & Plan: Post Achilles repair 10/25/2016, right. Incision looks good we'll start some physical therapy for heel cord gentle stretching. He'll be touchdown weightbearing and will recheck him in one month.  Chief Complaint:  Chief Complaint  Patient presents with  . Right Ankle - Follow-up   Visit Diagnoses:  1. Rupture of right Achilles tendon, subsequent encounter     Plan: Cast removed incision looks good we'll start some physical therapy he'll be touchdown weightbearing recheck 1 month and then we should be a low to increase his weightbearing and exercises.  Follow-Up Instructions: Return in about 1 month (around 01/13/2017).   Orders:  No orders of the defined types were placed in this encounter.  No orders of the defined types were placed in this encounter.   Imaging: No results found.  PMFS History: Patient Active Problem List   Diagnosis Date Noted  . Achilles tendon rupture 10/25/2016   Past Medical History:  Diagnosis Date  . Achilles tendon rupture 09/2016   right  . Dental crowns present     No family history on file.  Past Surgical History:  Procedure Laterality Date  . ACHILLES TENDON SURGERY Right 10/25/2016   Procedure: RIGHT ACHILLES TENDON REPAIR;  Surgeon: Eldred MangesYates, Rajendra Spiller C, MD;  Location: [facility];  Service: Orthopedics;  Laterality: Right;  Achilles Tendon  . NO PAST SURGERIES     Social History   Occupational History  . Not on file.   Social History Main Topics  . Smoking status: Former Smoker    Quit date: 02/20/2012  . Smokeless tobacco: Never Used  . Alcohol use Yes     Comment: rarely  . Drug use: No  . Sexual activity: Not on file

## 2016-12-19 ENCOUNTER — Ambulatory Visit: Payer: 59 | Attending: Orthopaedic Surgery | Admitting: Physical Therapy

## 2016-12-19 DIAGNOSIS — M25671 Stiffness of right ankle, not elsewhere classified: Secondary | ICD-10-CM | POA: Diagnosis present

## 2016-12-19 DIAGNOSIS — M6281 Muscle weakness (generalized): Secondary | ICD-10-CM | POA: Diagnosis present

## 2016-12-19 DIAGNOSIS — R262 Difficulty in walking, not elsewhere classified: Secondary | ICD-10-CM

## 2016-12-19 DIAGNOSIS — R2689 Other abnormalities of gait and mobility: Secondary | ICD-10-CM | POA: Diagnosis present

## 2016-12-19 NOTE — Patient Instructions (Signed)
Ankle Dorsiflexion, Self-Mobilization, Sitting   Sit with feet flat. Slide one foot back until gentle stretch is felt. Keep entire foot on floor. Hold _30__ seconds. Repeat _3__ times per session. Do _2-3__ sessions per day.  Gastroc / Heel Cord Stretch - Seated With Towel   Sit on floor, towel around ball of foot. Gently pull foot in toward body, stretching heel cord and calf. Hold for _30__ seconds. Repeat on involved leg. Repeat _3__ times. Do _2-3__ times per day.  Ankle Alphabet   Using right ankle and foot only, trace the letters of the alphabet. Perform A to Z. Repeat __2__ times per set.   Strengthening: Straight Leg Raise (Phase 1)   Tighten muscles on front of right thigh, then lift leg __8-10__ inches from surface, keeping knee locked.  Repeat __15__ times per set. Do __2__ sets per session.   Straight Leg Raise: With External Leg Rotation   Lie on back with right leg straight, opposite leg bent. Rotate straight leg out and lift __8-10__ inches. Repeat _15___ times per set. Do _2___ sets per session.   Abduction   Lift leg up toward ceiling. Return.  Repeat __15__ times each leg. Do __2__ sessions per day.  Hip Extension: Prone    Tighten gluteal muscle. Lift one leg _15__ times. Restabilize pelvis. Repeat with other leg. Keep pelvis still. Be sure pelvis does not rotate and back does not arch. Do __2_ sets **With knee bent**

## 2016-12-19 NOTE — Therapy (Signed)
[facility] [address]  [address] [place], [place], [address] Phone: [phone]   Fax:  [phone]  Physical Therapy Evaluation  Patient Details  Name: Jeremy Nielsen MRN: [id number] Date of Birth: 1975/03/22 Referring Provider: Dr. Annell Greening  Encounter Date: 12/19/2016      PT End of Session - 12/19/16 0950    Visit Number 1   Number of Visits 12   Date for PT Re-Evaluation 01/30/17   PT Start Time 0843   PT Stop Time 0921   PT Time Calculation (min) 38 min   Activity Tolerance Patient tolerated treatment well   Behavior During Therapy [facility] for tasks assessed/performed      Past Medical History:  Diagnosis Date  . Achilles tendon rupture 09/2016   right  . Dental crowns present     Past Surgical History:  Procedure Laterality Date  . ACHILLES TENDON SURGERY Right 10/25/2016   Procedure: RIGHT ACHILLES TENDON REPAIR;  Surgeon: Eldred Manges, MD;  Location: [facility];  Service: Orthopedics;  Laterality: Right;  Achilles Tendon  . NO PAST SURGERIES      There were no vitals filed for this visit.       Subjective Assessment - 12/19/16 0845    Subjective Patient with Achilles rupture, was playing soccer and felt a "pop"  - surgical repair 10/25/16. Cast came off last week - now in boot. Using a knee walker - crutches gave patient back pain. TDWB currently with MD follow up in ~4 weeks. Orders for ankle ROM currently.    Pertinent History none   Patient Stated Goals improve ankle ROM and mobility   Currently in Pain? No/denies            [facility] PT Assessment - 12/19/16 0849      Assessment   Medical Diagnosis R Achilles repair   Referring Provider Dr. Annell Greening   Onset Date/Surgical Date 10/25/16   Next MD Visit --  4 weeks    Prior Therapy no     Precautions   Precautions Fall     Restrictions   Weight Bearing Restrictions Yes   RLE Weight Bearing Touchdown weight bearing   Other Position/Activity Restrictions CAM boot     Balance Screen   Has the patient fallen in the past 6 months No   Has the patient had a decrease in activity level because of a fear of falling?  No   Is the patient reluctant to leave their home because of a fear of falling?  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     Cognition   Overall Cognitive Status Within Functional Limits for tasks assessed     Observation/Other Assessments   Focus on Therapeutic Outcomes (FOTO)  Ankle: 36 (64% limited, predicted 39% limited)     Sensation   Light Touch Appears Intact     Coordination   Gross Motor Movements are Fluid and Coordinated No  due to recent surgical procedure     ROM / Strength   AROM /  PROM / Strength AROM;PROM     AROM   AROM Assessment Site Ankle   Right/Left Ankle Right   Right Ankle Dorsiflexion -15   Right Ankle Plantar Flexion 40   Right Ankle Inversion 22   Right Ankle Eversion 8     PROM   PROM Assessment Site Ankle   Right/Left Ankle Right   Right Ankle Dorsiflexion -12   Right Ankle Plantar Flexion 45   Right Ankle Inversion 25   Right Ankle Eversion 30     Palpation   Palpation comment diffusely non-tender     Ambulation/Gait   Gait Comments ambulating with knee walker            Objective measurements completed on examination: See above findings.          [facility] Adult PT Treatment/Exercise - 12/19/16 0849      Exercises   Exercises Knee/Hip;Ankle     Knee/Hip Exercises: Supine   Straight Leg Raises Strengthening;Right;15 reps   Straight Leg Raise with  External Rotation Strengthening;Right;15 reps     Knee/Hip Exercises: Sidelying   Hip ABduction Strengthening;Right;15 reps     Knee/Hip Exercises: Prone   Hip Extension Strengthening;Right;15 reps   Hip Extension Limitations with knee at 90 deg flexion     Ankle Exercises: Stretches   Gastroc Stretch 3 reps;30 seconds   Gastroc Stretch Limitations 2 sets - with use of towel in long sitting     Ankle Exercises: Seated   ABC's 1 rep   ABC's Limitations performed in long sitting                PT Education - 12/19/16 0918    Education provided Yes   Education Details exam findings, benefit periods/limitations, POC, HEP   Person(s) Educated Patient   Methods Explanation;Demonstration;Handout   Comprehension Verbalized understanding;Returned demonstration          PT Short Term Goals - 12/19/16 0944      PT SHORT TERM GOAL #1   Title patient to be independent with initial HEP   Status New   Target Date 01/09/17     PT SHORT TERM GOAL #2   Title patient to improve R ankle DF PROM 0 degrees (neutral) to allow for improved functional mobility   Status New   Target Date 01/09/17           PT Long Term Goals - 12/19/16 0945      PT LONG TERM GOAL #1   Title patient to be independent with advanced HEP   Status New   Target Date 01/30/17     PT LONG TERM GOAL #2   Title patient to demonstrate good heel toe gait pattern through WB status as MD allows    Status New   Target Date 01/30/17     PT LONG TERM GOAL #3   Title patient to improve R ankle AROM into DF to >/= 5 degrees to allow for improved functional mobility   Status New   Target Date 01/30/17     PT LONG TERM GOAL #4   Title patient to demonstrate stair navigation with single handrail with WB status as MD allows without instability or LOB   Status New   Target Date 01/30/17                Plan - 12/19/16 0950    Clinical Impression Statement Patient is a 41 y/o male s/p R Achilles  repair on 10/25/16. Patient presenting to  PT today with knee walker as well as WB restrictions of TDWB and orders for gentle stretching until MD follow-up in approx 4 weeks. Paitent with limited PROM and AROM at R ankle with slight pain limiting further dorsiflexion ROM. Patient given initial HEP for gentle AROM and stretching at R ankle with good carryover. Hip strengthening also initiated today to prepare for eventual gait ttraining to ensure good hip strength/support. Patient to benefit from PT to address functional mobility limitations to allow for improved mobility and QOL.    Clinical Presentation Stable   Clinical Decision Making Low   Rehab Potential Good   PT Frequency 2x / week   PT Duration 6 weeks  will plan to start with initial frequency of 1x/week due to WB restrictions   PT Treatment/Interventions ADLs/Self Care Home Management;Cryotherapy;Electrical Stimulation;Moist Heat;Ultrasound;Passive range of motion;Vasopneumatic Device;Gait training;Stair training;Functional mobility training;Therapeutic activities;Therapeutic exercise;Patient/family education;Balance training;Neuromuscular re-education;Manual techniques;Scar mobilization;Dry needling;Taping   Consulted and Agree with Plan of Care Patient      Patient will benefit from skilled therapeutic intervention in order to improve the following deficits and impairments:  Abnormal gait, Decreased activity tolerance, Decreased strength, Decreased balance, Decreased mobility, Difficulty walking, Decreased range of motion, Increased edema  Visit Diagnosis: Stiffness of right ankle, not elsewhere classified - Plan: PT plan of care cert/re-cert  Difficulty in walking, not elsewhere classified - Plan: PT plan of care cert/re-cert  Other abnormalities of gait and mobility - Plan: PT plan of care cert/re-cert  Muscle weakness (generalized) - Plan: PT plan of care cert/re-cert     Problem List Patient Active Problem List   Diagnosis  Date Noted  . Achilles tendon rupture 10/25/2016     Kipp Laurence, PT, DPT 12/19/16 9:59 AM   [facility] [address]  [address] [place], [place], [address] Phone: [phone]   Fax:  [phone]  Name: Jeremy Nielsen MRN: [id number] Date of Birth: [birth year]

## 2016-12-26 ENCOUNTER — Encounter: Payer: Self-pay | Admitting: Physical Therapy

## 2016-12-26 ENCOUNTER — Ambulatory Visit: Payer: 59 | Attending: Orthopaedic Surgery | Admitting: Physical Therapy

## 2016-12-26 DIAGNOSIS — R262 Difficulty in walking, not elsewhere classified: Secondary | ICD-10-CM | POA: Diagnosis present

## 2016-12-26 DIAGNOSIS — M6281 Muscle weakness (generalized): Secondary | ICD-10-CM

## 2016-12-26 DIAGNOSIS — M25671 Stiffness of right ankle, not elsewhere classified: Secondary | ICD-10-CM | POA: Insufficient documentation

## 2016-12-26 DIAGNOSIS — R2689 Other abnormalities of gait and mobility: Secondary | ICD-10-CM | POA: Insufficient documentation

## 2016-12-26 NOTE — Therapy (Signed)
[facility] [place] [address]  [address] [place], [place], [address] Phone: [phone]   Fax:  [phone]  Physical Therapy Treatment  Patient Details  Name: Jeremy Nielsen MRN: [id number] Date of Birth: 12/19/1975 Referring Provider: Dr. Annell GreeningMark Yates   Encounter Date: 12/26/2016  PT End of Session - 12/26/16 0838    Visit Number  2    Number of Visits  12    Date for PT Re-Evaluation  01/30/17    PT Start Time  0835    PT Stop Time  0916    PT Time Calculation (min)  41 min    Activity Tolerance  Patient tolerated treatment well    Behavior During Therapy  [facility] for tasks assessed/performed       Past Medical History:  Diagnosis Date  . Achilles tendon rupture 09/2016   right  . Dental crowns present     Past Surgical History:  Procedure Laterality Date  . NO PAST SURGERIES      There were no vitals filed for this visit.  Subjective Assessment - 12/26/16 0837    Subjective  Feels like hes getting some motion back. Stretching doesn't hurt, but can feel a stretch    Patient Stated Goals  improve ankle ROM and mobility    Currently in Pain?  No/denies    Multiple Pain Sites  No                      [facility] Adult PT Treatment/Exercise - 12/26/16 0839      Knee/Hip Exercises: Stretches   Passive Hamstring Stretch  Right;3 reps;30 seconds    Passive Hamstring Stretch Limitations  supine with strap    Gastroc Stretch  Right;3 reps;30 seconds    Gastroc Stretch Limitations  manual by PT    Soleus Stretch  Right;3 reps;30 seconds    Soleus Stretch Limitations  manual by PT      Knee/Hip Exercises: Standing   Hip Flexion  Stengthening;Right;15 reps;Knee bent;2 sets    Hip Flexion Limitations  3# - high knee march    Hip Abduction  Stengthening;Right;15 reps;Knee straight;2 sets    Abduction Limitations  3# - B UE support    Hip Extension  Stengthening;Right;15 reps;Knee straight;2 sets    Extension  Limitations  3# - B UE support      Knee/Hip Exercises: Supine   Straight Leg Raises  Strengthening;Right;15 reps    Straight Leg Raises Limitations  3#    Straight Leg Raise with External Rotation  Strengthening;Right;15 reps    Straight Leg Raise with External Rotation Limitations  3#    Other Supine Knee/Hip Exercises  isometric hip flexion 15 reps for 3-5 sec hold      Manual Therapy   Manual Therapy  Soft tissue mobilization;Passive ROM    Manual therapy comments  patient prone    Soft tissue mobilization  STM to R gastroc/soleus complex with overpressure into DF; scar massage    Passive ROM  ROM into DF - knee straight and knee bent - greatest stretch with knee bent               PT Short Term Goals - 12/26/16 [id number]      PT SHORT TERM GOAL #1   Title  patient to be independent with initial HEP    Status  On-going      PT SHORT TERM GOAL #2   Title  patient to  improve R ankle DF PROM 0 degrees (neutral) to allow for improved functional mobility    Status  On-going        PT Long Term Goals - 12/26/16 [id number]      PT LONG TERM GOAL #1   Title  patient to be independent with advanced HEP    Status  On-going      PT LONG TERM GOAL #2   Title  patient to demonstrate good heel toe gait pattern through WB status as MD allows     Status  On-going      PT LONG TERM GOAL #3   Title  patient to improve R ankle AROM into DF to >/= 5 degrees to allow for improved functional mobility    Status  On-going      PT LONG TERM GOAL #4   Title  patient to demonstrate stair navigation with single handrail with WB status as MD allows without instability or LOB    Status  On-going            Plan - 12/26/16 0838    Clinical Impression Statement  Brett CanalesSteve noticing improvements in R ankle ROM since beginning PT. Manual continued today to promote good soft tissue as well as increased flexibility. Patient noting greatest stretch with knee bent, demonstrating increased tightness in  soleus mm group. Good tolerance to all progressions of hip strengthening with patient noting muscle soreness after last visit, likely due to disuse since surgery. Will continue to progress as patient tolerates and orders allow.     PT Treatment/Interventions  ADLs/Self Care Home Management;Cryotherapy;Electrical Stimulation;Moist Heat;Ultrasound;Passive range of motion;Vasopneumatic Device;Gait training;Stair training;Functional mobility training;Therapeutic activities;Therapeutic exercise;Patient/family education;Balance training;Neuromuscular re-education;Manual techniques;Scar mobilization;Dry needling;Taping    Consulted and Agree with Plan of Care  Patient       Patient will benefit from skilled therapeutic intervention in order to improve the following deficits and impairments:  Abnormal gait, Decreased activity tolerance, Decreased strength, Decreased balance, Decreased mobility, Difficulty walking, Decreased range of motion, Increased edema  Visit Diagnosis: Stiffness of right ankle, not elsewhere classified  Difficulty in walking, not elsewhere classified  Other abnormalities of gait and mobility  Muscle weakness (generalized)     Problem List Patient Active Problem List   Diagnosis Date Noted  . Achilles tendon rupture 10/25/2016     Kipp LaurenceStephanie R Keiley Levey, PT, DPT 12/26/16 9:27 AM   [facility] [place] [address]  [address] [place], [place], [address] Phone: [phone]   Fax:  [phone]  Name: Jeremy HongSteve Nielsen MRN: [id number] Date of Birth: 07/15/1975

## 2016-12-26 NOTE — Patient Instructions (Signed)
Bilateral Isometric Hip Flexion    Tighten stomach and raise both knees to outstretched arms. Push gently, keeping arms straight, trunk rigid. Hold __3-5__ seconds. Repeat __15__ times per set. Do __2__ sets per session.   HIP: Abduction - Standing   Squeeze glutes. Raise leg out and slightly back. _15__ reps per set, _2__ sets per day.  Standing Hip Extension    Bring leg back as far as possible. Use __-__ lbs on ankle. Repeat __15__ times. Do _2___ sessions per day.

## 2017-01-02 ENCOUNTER — Ambulatory Visit: Payer: 59 | Admitting: Physical Therapy

## 2017-01-02 ENCOUNTER — Encounter: Payer: Self-pay | Admitting: Physical Therapy

## 2017-01-02 DIAGNOSIS — R2689 Other abnormalities of gait and mobility: Secondary | ICD-10-CM

## 2017-01-02 DIAGNOSIS — M25671 Stiffness of right ankle, not elsewhere classified: Secondary | ICD-10-CM

## 2017-01-02 DIAGNOSIS — R262 Difficulty in walking, not elsewhere classified: Secondary | ICD-10-CM

## 2017-01-02 DIAGNOSIS — M6281 Muscle weakness (generalized): Secondary | ICD-10-CM

## 2017-01-02 NOTE — Therapy (Signed)
[facility] [place] [address]  [address] [place], [place], [address] Phone: [phone]   Fax:  [phone]  Physical Therapy Treatment  Patient Details  Name: Jeremy HongSteve Nielsen MRN: [id number] Date of Birth: 05/28/1975 Referring Provider: Dr. Annell GreeningMark Yates   Encounter Date: 01/02/2017  PT End of Session - 01/02/17 0848    Visit Number  3    Number of Visits  12    Date for PT Re-Evaluation  01/30/17    PT Start Time  0845    PT Stop Time  0925    PT Time Calculation (min)  40 min    Activity Tolerance  Patient tolerated treatment well    Behavior During Therapy  [facility] for tasks assessed/performed       Past Medical History:  Diagnosis Date  . Achilles tendon rupture 09/2016   right  . Dental crowns present     Past Surgical History:  Procedure Laterality Date  . NO PAST SURGERIES      There were no vitals filed for this visit.  Subjective Assessment - 01/02/17 0848    Subjective  doing well - added a pad to knee walker;     Patient Stated Goals  improve ankle ROM and mobility    Currently in Pain?  No/denies    Multiple Pain Sites  No                      [facility] Adult PT Treatment/Exercise - 01/02/17 0850      Knee/Hip Exercises: Aerobic   Recumbent Bike  no resistance for ROM x 6 min      Knee/Hip Exercises: Standing   Hip Flexion  Stengthening;Right;15 reps;Knee bent;2 sets;Knee straight    Hip Flexion Limitations  3#    Hip Abduction  Stengthening;Right;15 reps;Knee straight;2 sets    Abduction Limitations  3# - B UE support    Hip Extension  Stengthening;Right;15 reps;Knee straight;2 sets    Extension Limitations  3# - B UE support      Knee/Hip Exercises: Seated   Long Arc Quad  Right;15 reps;Weights;2 sets with and without ball squeeze    Long Arc Quad Weight  3 lbs.    Hamstring Curl  Right;15 reps;2 sets    Hamstring Limitations  green tband      Knee/Hip Exercises: Supine   Other  Supine Knee/Hip Exercises  dead bug - alternating UE/LE x 15 reps each    Other Supine Knee/Hip Exercises  HS bridge with LE extended on peanut ball - 15 x 5 sec hold      Manual Therapy   Manual Therapy  Soft tissue mobilization;Passive ROM    Manual therapy comments  patient prone    Soft tissue mobilization  STM to R gastroc/soleus complex with overpressure into DF; scar massage    Passive ROM  ROM into gentle DF - knee straight and knee bent                PT Short Term Goals - 12/26/16 [id number]      PT SHORT TERM GOAL #1   Title  patient to be independent with initial HEP    Status  On-going      PT SHORT TERM GOAL #2   Title  patient to improve R ankle DF PROM 0 degrees (neutral) to allow for improved functional mobility    Status  On-going        PT Long  Term Goals - 12/26/16 [id number]      PT LONG TERM GOAL #1   Title  patient to be independent with advanced HEP    Status  On-going      PT LONG TERM GOAL #2   Title  patient to demonstrate good heel toe gait pattern through WB status as MD allows     Status  On-going      PT LONG TERM GOAL #3   Title  patient to improve R ankle AROM into DF to >/= 5 degrees to allow for improved functional mobility    Status  On-going      PT LONG TERM GOAL #4   Title  patient to demonstrate stair navigation with single handrail with WB status as MD allows without instability or LOB    Status  On-going            Plan - 01/02/17 0849    Clinical Impression Statement  Patient doing well today - has added a knee pad for knee walker. patient tolerating all manual STM and gentle stretching with knee bent and straight with continued most stretch felt with knee bent. Patient tolerating all progression in light strengthening even with recumbent bike with no resistance for ankle ROM. Will continue to progress per MD recommendations and await further advice from MD at follow-up next week.     PT Treatment/Interventions  ADLs/Self Care  Home Management;Cryotherapy;Electrical Stimulation;Moist Heat;Ultrasound;Passive range of motion;Vasopneumatic Device;Gait training;Stair training;Functional mobility training;Therapeutic activities;Therapeutic exercise;Patient/family education;Balance training;Neuromuscular re-education;Manual techniques;Scar mobilization;Dry needling;Taping    Consulted and Agree with Plan of Care  Patient       Patient will benefit from skilled therapeutic intervention in order to improve the following deficits and impairments:  Abnormal gait, Decreased activity tolerance, Decreased strength, Decreased balance, Decreased mobility, Difficulty walking, Decreased range of motion, Increased edema  Visit Diagnosis: Stiffness of right ankle, not elsewhere classified  Difficulty in walking, not elsewhere classified  Other abnormalities of gait and mobility  Muscle weakness (generalized)     Problem List Patient Active Problem List   Diagnosis Date Noted  . Achilles tendon rupture 10/25/2016     Jeremy LaurenceStephanie R Nielsen Figge, PT, DPT 01/02/17 10:23 AM   [facility] [place] [address]  [address] [place], [place], [address] Phone: [phone]   Fax:  [phone]  Name: Jeremy HongSteve Nielsen MRN: [id number] Date of Birth: 02/25/1975

## 2017-01-09 ENCOUNTER — Encounter: Payer: Self-pay | Admitting: Physical Therapy

## 2017-01-09 ENCOUNTER — Ambulatory Visit: Payer: 59 | Admitting: Physical Therapy

## 2017-01-09 DIAGNOSIS — R262 Difficulty in walking, not elsewhere classified: Secondary | ICD-10-CM

## 2017-01-09 DIAGNOSIS — R2689 Other abnormalities of gait and mobility: Secondary | ICD-10-CM

## 2017-01-09 DIAGNOSIS — M25671 Stiffness of right ankle, not elsewhere classified: Secondary | ICD-10-CM

## 2017-01-09 DIAGNOSIS — M6281 Muscle weakness (generalized): Secondary | ICD-10-CM

## 2017-01-09 NOTE — Therapy (Signed)
[facility] [place] [address]  [address] [place], [place], [address] Phone: [phone]   Fax:  [phone]  Physical Therapy Treatment  Patient Details  Name: Jeremy HongSteve Nielsen MRN: [id number] Date of Birth: 03/20/1975 Referring Provider: Dr. Annell GreeningMark Yates   Encounter Date: 01/09/2017  PT End of Session - 01/09/17 1034    Visit Number  4    Number of Visits  12    Date for PT Re-Evaluation  01/30/17    PT Start Time  0845    PT Stop Time  0931    PT Time Calculation (min)  46 min    Activity Tolerance  Patient tolerated treatment well    Behavior During Therapy  [facility] for tasks assessed/performed       Past Medical History:  Diagnosis Date  . Achilles tendon rupture 09/2016   right  . Dental crowns present     Past Surgical History:  Procedure Laterality Date  . ACHILLES TENDON SURGERY Right 10/25/2016   Procedure: RIGHT ACHILLES TENDON REPAIR;  Surgeon: Eldred MangesYates, Mark C, MD;  Location: [facility];  Service: Orthopedics;  Laterality: Right;  Achilles Tendon  . NO PAST SURGERIES      There were no vitals filed for this visit.  Subjective Assessment - 01/09/17 0848    Subjective  Patient feeling good and continues to have no pain. No longer wearing boot at all. Able to wear regular tennis shoe on R foot and doing well with driving.     Currently in Pain?  No/denies    Multiple Pain Sites  No         [facility] PT Assessment - 01/09/17 0855      AROM   AROM Assessment Site  Ankle    Right/Left Ankle  Right    Right Ankle Dorsiflexion  -2    Right Ankle Plantar Flexion  42      PROM   PROM Assessment Site  Ankle    Right/Left Ankle  Right    Right Ankle Dorsiflexion  2    Right Ankle Plantar Flexion  52                  [facility] Adult PT Treatment/Exercise - 01/09/17 0849      Knee/Hip Exercises: Stretches   Other Knee/Hip Stretches  foam roll to R gastroc; 2 x 1 min.      Knee/Hip Exercises:  Aerobic   Recumbent Bike  no resistance for ROM x 6 min      Knee/Hip Exercises: Machines for Strengthening   Cybex Knee Extension  20#; B concentric, R eccentric; 15 reps      Knee/Hip Exercises: Seated   Hamstring Curl  Right;15 reps;2 sets    Hamstring Limitations  green tband      Knee/Hip Exercises: Supine   Other Supine Knee/Hip Exercises  bridge on peanut ball with HS curl; 15 reps    Other Supine Knee/Hip Exercises  HS bridge with LE extended on peanut ball - 15 x 5 sec hold      Knee/Hip Exercises: Prone   Other Prone Exercises  quadruped - bird dog; 10 reps each side    Other Prone Exercises  quadruped R hip extension; 3#; 2 x 15 reps      Manual Therapy   Manual Therapy  Soft tissue mobilization;Passive ROM    Manual therapy comments  patient prone    Soft tissue mobilization  scar massage to  R achilles    Passive ROM  ROM into gentle DF - knee straight and knee bent       Ankle Exercises: Seated   Other Seated Ankle Exercises  active DF with yellow tband; 2 x 15 reps      Ankle Exercises: Stretches   Soleus Stretch  2 reps;30 seconds passive stretch by PT; patient prone, knee bent               PT Short Term Goals - 12/26/16 [id number]      PT SHORT TERM GOAL #1   Title  patient to be independent with initial HEP    Status  On-going      PT SHORT TERM GOAL #2   Title  patient to improve R ankle DF PROM 0 degrees (neutral) to allow for improved functional mobility    Status  On-going        PT Long Term Goals - 12/26/16 [id number]      PT LONG TERM GOAL #1   Title  patient to be independent with advanced HEP    Status  On-going      PT LONG TERM GOAL #2   Title  patient to demonstrate good heel toe gait pattern through WB status as MD allows     Status  On-going      PT LONG TERM GOAL #3   Title  patient to improve R ankle AROM into DF to >/= 5 degrees to allow for improved functional mobility    Status  On-going      PT LONG TERM GOAL #4   Title   patient to demonstrate stair navigation with single handrail with WB status as MD allows without instability or LOB    Status  On-going            Plan - 01/09/17 1034    Clinical Impression Statement  Reassessed patient ROM today, with significant improvements in R ankle motion. Some tightness remaining in both DF and PF. Patient progressing well with LE strengthening in NWB positions. Posterior lower leg/ankle strengthening deferred up to this point as original orders were for HC stretching. Will progress patient exercise program per MD at next visit.     PT Treatment/Interventions  ADLs/Self Care Home Management;Cryotherapy;Electrical Stimulation;Moist Heat;Ultrasound;Passive range of motion;Vasopneumatic Device;Gait training;Stair training;Functional mobility training;Therapeutic activities;Therapeutic exercise;Patient/family education;Balance training;Neuromuscular re-education;Manual techniques;Scar mobilization;Dry needling;Taping    Consulted and Agree with Plan of Care  Patient       Patient will benefit from skilled therapeutic intervention in order to improve the following deficits and impairments:  Abnormal gait, Decreased activity tolerance, Decreased strength, Decreased balance, Decreased mobility, Difficulty walking, Decreased range of motion, Increased edema  Visit Diagnosis: Stiffness of right ankle, not elsewhere classified  Difficulty in walking, not elsewhere classified  Other abnormalities of gait and mobility  Muscle weakness (generalized)     Problem List Patient Active Problem List   Diagnosis Date Noted  . Achilles tendon rupture 10/25/2016     Emerson MonteKimberly Tel Hevia, SPT 01/09/17 12:54 PM    [facility] [place] [address]  [address] [place], [place], [address] Phone: [phone]   Fax:  [phone]  Name: Jeremy HongSteve Nielsen MRN: [id number] Date of Birth: 12/31/1975

## 2017-01-10 ENCOUNTER — Ambulatory Visit (INDEPENDENT_AMBULATORY_CARE_PROVIDER_SITE_OTHER): Payer: 59 | Admitting: Orthopaedic Surgery

## 2017-01-10 ENCOUNTER — Encounter (INDEPENDENT_AMBULATORY_CARE_PROVIDER_SITE_OTHER): Payer: Self-pay | Admitting: Orthopaedic Surgery

## 2017-01-10 VITALS — BP 124/83 | HR 79

## 2017-01-10 DIAGNOSIS — Z9889 Other specified postprocedural states: Secondary | ICD-10-CM

## 2017-01-10 NOTE — Progress Notes (Signed)
   Post-Op Visit Note   Patient: Jeremy Nielsen           Date of Birth: 03/22/1975           MRN: [id number] Visit Date: 01/10/2017 PCP: Jeremy Nielsen, Jeremy A, Nielsen   Assessment & Plan: Note given for 50% weightbearing with progression to full weightbearing as tolerated.  He can ambulate at home with his boot on and remove the boot as he works with progressive weightbearing with therapy.  I plan to check him back again in 1 month.  Chief Complaint:  Chief Complaint  Patient presents with  . Right Ankle - Follow-up   Visit Diagnoses:  1. H/O Achilles tendon repair     Plan: 50% weightbearing.  He has more granulation and thickening of the tendon above and below it but has resistance with palpable tendon throughout the length of the repair.  He can begin with progressive weightbearing 50% and progress as tolerated to full weightbearing.  When he walks at home he will start using his boot for protection and then progress to ambulating in his shoe with weightbearing as tolerated.  I plan to recheck him in 1 month.  Follow-Up Instructions: No Follow-up on file.   Orders:  No orders of the defined types were placed in this encounter.  No orders of the defined types were placed in this encounter.   Imaging: No results found.  PMFS History: Patient Active Problem List   Diagnosis Date Noted  . Achilles tendon rupture 10/25/2016   Past Medical History:  Diagnosis Date  . Achilles tendon rupture 09/2016   right  . Dental crowns present     History reviewed. No pertinent family history.  Past Surgical History:  Procedure Laterality Date  . ACHILLES TENDON SURGERY Right 10/25/2016   Procedure: RIGHT ACHILLES TENDON REPAIR;  Surgeon: Jeremy Nielsen, Jeremy Kamaka C, MD;  Location: [facility];  Service: Orthopedics;  Laterality: Right;  Achilles Tendon  . NO PAST SURGERIES     Social History   Occupational History  . Not on file  Tobacco Use  . Smoking status: Former Smoker    Last  attempt to quit: 02/20/2012    Years since quitting: 4.8  . Smokeless tobacco: Never Used  Substance and Sexual Activity  . Alcohol use: Yes    Comment: rarely  . Drug use: No  . Sexual activity: Not on file

## 2017-01-16 ENCOUNTER — Ambulatory Visit: Payer: 59 | Admitting: Physical Therapy

## 2017-01-16 ENCOUNTER — Encounter: Payer: Self-pay | Admitting: Physical Therapy

## 2017-01-16 DIAGNOSIS — R2689 Other abnormalities of gait and mobility: Secondary | ICD-10-CM

## 2017-01-16 DIAGNOSIS — M6281 Muscle weakness (generalized): Secondary | ICD-10-CM

## 2017-01-16 DIAGNOSIS — M25671 Stiffness of right ankle, not elsewhere classified: Secondary | ICD-10-CM | POA: Diagnosis not present

## 2017-01-16 DIAGNOSIS — R262 Difficulty in walking, not elsewhere classified: Secondary | ICD-10-CM

## 2017-01-16 NOTE — Patient Instructions (Addendum)
Toe Raise (Standing)    Use counter for support. Rock back on heels. Repeat _10__ times per set. Do __2__ sets per session. Bridge    Lie back, legs bent. Inhale, pressing hips up. Keeping ribs in, lengthen lower back. Exhale, rolling down along spine from top. Repeat __15__ times.   Inversion: Resisted    Cross legs with right leg underneath, foot in tubing loop. Hold tubing around other foot to resist and turn foot in. Repeat _15___ times per set. Do _2___ sets per session.  Eversion: Resisted    With right foot in tubing loop, hold tubing around other foot to resist and turn foot out. Repeat __15__ times per set. Do __2__ sets per session.   ANKLE: Pump With Resistance Band    With exercise band around arch of involved foot, bend foot and toes toward floor against the band's resistance. Repeat _15__ times. Do _2__ times per day.  Dorsiflexion: Resisted    Facing anchor, tubing around left foot, pull toward face.  Repeat _15___ times per set. Do __2__ sets per session.

## 2017-01-16 NOTE — Therapy (Signed)
[facility] [place] [address]  [address] [place], [place], [address] Phone: [phone]   Fax:  [phone]  Physical Therapy Treatment  Patient Details  Name: Jeremy HongSteve Angst MRN: [id number] Date of Birth: 11/19/1975 Referring Provider: Dr. Annell GreeningMark Yates   Encounter Date: 01/16/2017  PT End of Session - 01/16/17 1149    Visit Number  5    Number of Visits  12    Date for PT Re-Evaluation  01/30/17    PT Start Time  0846    PT Stop Time  0932    PT Time Calculation (min)  46 min    Activity Tolerance  Patient tolerated treatment well    Behavior During Therapy  [facility] for tasks assessed/performed       Past Medical History:  Diagnosis Date  . Achilles tendon rupture 09/2016   right  . Dental crowns present     Past Surgical History:  Procedure Laterality Date  . ACHILLES TENDON SURGERY Right 10/25/2016   Procedure: RIGHT ACHILLES TENDON REPAIR;  Surgeon: Eldred MangesYates, Mark C, MD;  Location: [facility];  Service: Orthopedics;  Laterality: Right;  Achilles Tendon  . NO PAST SURGERIES      There were no vitals filed for this visit.  Subjective Assessment - 01/16/17 0848    Subjective  Patient cleared by MD to begin 50% WB with no boot. Currently ambulating with one crutch. Patient reporting he has been able to return to walking his normal distances with no issue. If excessive walking is required patient has used scooter. Some pain in R knee reported since beginning walking again, patient feels this is due to weakness and abnormal gait pattern with crutch.     Currently in Pain?  No/denies    Multiple Pain Sites  No                      [facility] Adult PT Treatment/Exercise - 01/16/17 0851      Ambulation/Gait   Gait Comments  2 point, step through gait pattern with single crutch in L UE; 2 x 18500ft      Knee/Hip Exercises: Aerobic   Recumbent Bike  L2 x 6 min.      Knee/Hip Exercises: Standing   Heel  Raises  Both;10 reps UE support    Wall Squat  15 reps;3 seconds      Knee/Hip Exercises: Supine   Bridges  Both;15 reps      Manual Therapy   Manual Therapy  Soft tissue mobilization    Manual therapy comments  patient prone    Soft tissue mobilization  scar massage to R achilles    Passive ROM  ROM into gentle DF - knee straight and knee bent       Ankle Exercises: Stretches   Gastroc Stretch  3 reps;30 seconds    Gastroc Stretch Limitations  blue rocker      Ankle Exercises: Seated   Other Seated Ankle Exercises  4 way ankle strengthening; yellow tband; 15 reps each      Ankle Exercises: Standing   Toe Raise  10 reps UE support             PT Education - 01/16/17 1147    Education provided  Yes    Education Details  proper use of single crutch, 2 point gait pattern with crutch, proper heel strike to toe off during stance with gait    Person(s) Educated  Patient    Methods  Explanation;Demonstration    Comprehension  Verbalized understanding;Returned demonstration       PT Short Term Goals - 12/26/16 [id number]      PT SHORT TERM GOAL #1   Title  patient to be independent with initial HEP    Status  On-going      PT SHORT TERM GOAL #2   Title  patient to improve R ankle DF PROM 0 degrees (neutral) to allow for improved functional mobility    Status  On-going        PT Long Term Goals - 12/26/16 [id number]      PT LONG TERM GOAL #1   Title  patient to be independent with advanced HEP    Status  On-going      PT LONG TERM GOAL #2   Title  patient to demonstrate good heel toe gait pattern through WB status as MD allows     Status  On-going      PT LONG TERM GOAL #3   Title  patient to improve R ankle AROM into DF to >/= 5 degrees to allow for improved functional mobility    Status  On-going      PT LONG TERM GOAL #4   Title  patient to demonstrate stair navigation with single handrail with WB status as MD allows without instability or LOB    Status  On-going             Plan - 01/16/17 1150    Clinical Impression Statement  Patient returned to MD last Wednesday who advised "Note given for 50% weightbearing with progression to full weightbearing as tolerated." Began WB activities today with patient to include mild strengthening and ROM in standing. Updated patient HEP to progress ankle strengthening at home. Instructed patient on proper use and gait pattern with single crutch to improve patient mobility and prevent compensatory patterns. Will continue to progress WB and strengthening as patient tolerates.     PT Treatment/Interventions  ADLs/Self Care Home Management;Cryotherapy;Electrical Stimulation;Moist Heat;Ultrasound;Passive range of motion;Vasopneumatic Device;Gait training;Stair training;Functional mobility training;Therapeutic activities;Therapeutic exercise;Patient/family education;Balance training;Neuromuscular re-education;Manual techniques;Scar mobilization;Dry needling;Taping    Consulted and Agree with Plan of Care  Patient       Patient will benefit from skilled therapeutic intervention in order to improve the following deficits and impairments:  Abnormal gait, Decreased activity tolerance, Decreased strength, Decreased balance, Decreased mobility, Difficulty walking, Decreased range of motion, Increased edema  Visit Diagnosis: Stiffness of right ankle, not elsewhere classified  Difficulty in walking, not elsewhere classified  Other abnormalities of gait and mobility  Muscle weakness (generalized)     Problem List There are no active problems to display for this patient.   Emerson MonteKimberly Amelda Hapke, SPT 01/16/17 11:55 AM    [facility] [place] [address]  [address] [place], [place], [address] Phone: [phone]   Fax:  [phone]  Name: Jeremy HongSteve Nielsen MRN: [id number] Date of Birth: 09/14/1975

## 2017-01-19 ENCOUNTER — Ambulatory Visit: Payer: 59 | Admitting: Physical Therapy

## 2017-01-19 ENCOUNTER — Encounter: Payer: Self-pay | Admitting: Physical Therapy

## 2017-01-19 DIAGNOSIS — R262 Difficulty in walking, not elsewhere classified: Secondary | ICD-10-CM

## 2017-01-19 DIAGNOSIS — M6281 Muscle weakness (generalized): Secondary | ICD-10-CM

## 2017-01-19 DIAGNOSIS — M25671 Stiffness of right ankle, not elsewhere classified: Secondary | ICD-10-CM

## 2017-01-19 DIAGNOSIS — R2689 Other abnormalities of gait and mobility: Secondary | ICD-10-CM

## 2017-01-19 NOTE — Therapy (Signed)
[facility] [place] [address]  [address] [place], [place], [address] Phone: [phone]   Fax:  [phone]  Physical Therapy Treatment  Patient Details  Name: Jeremy HongSteve Nielsen MRN: [id number] Date of Birth: 07/11/1975 Referring Provider: Dr. Annell GreeningMark Yates   Encounter Date: 01/19/2017  PT End of Session - 01/19/17 1041    Visit Number  6    Number of Visits  12    Date for PT Re-Evaluation  01/30/17    PT Start Time  0843    PT Stop Time  0928    PT Time Calculation (min)  45 min    Activity Tolerance  Patient tolerated treatment well    Behavior During Therapy  [facility] for tasks assessed/performed       Past Medical History:  Diagnosis Date  . Achilles tendon rupture 09/2016   right  . Dental crowns present     Past Surgical History:  Procedure Laterality Date  . ACHILLES TENDON SURGERY Right 10/25/2016   Procedure: RIGHT ACHILLES TENDON REPAIR;  Surgeon: Eldred MangesYates, Mark C, MD;  Location: [facility];  Service: Orthopedics;  Laterality: Right;  Achilles Tendon  . NO PAST SURGERIES      There were no vitals filed for this visit.  Subjective Assessment - 01/19/17 0847    Subjective  Patient feeling good today and has been doing well ambulating with crutch. Stating he has increased his walking quite a bit over the last several days and tolerated well, however he notices swelling in lateral side of ankle at the end of the day that is decreased by morning.     Currently in Pain?  No/denies    Multiple Pain Sites  No                      [facility] Adult PT Treatment/Exercise - 01/19/17 0848      Ambulation/Gait   Gait Comments  ambulation w/ no assistive device next to counter for support; cues for       Knee/Hip Exercises: Aerobic   Recumbent Bike  L2 x 6 min.      Knee/Hip Exercises: Machines for Strengthening   Cybex Leg Press  B; 15 reps; 20#    Other Machine  B; calf raise on leg press; 15 reps;  15#      Knee/Hip Exercises: Standing   Heel Raises  Both;15 reps UE support at counter    Forward Lunges  Right;Left;10 reps 1/2 lunge at counter; good w/ R forward; unable w/ L forward    Functional Squat  15 reps TRX; minor weight shift to L LE    Wall Squat  15 reps;3 seconds ball squeeze    Other Standing Knee Exercises  side stepping; yellow tband; 3 x 9110ft each way patient cautious on R but able to perform slowly      Knee/Hip Exercises: Supine   Bridges  15 reps B bridge, L kick out    Single Leg Bridge  Right;10 reps               PT Short Term Goals - 12/26/16 [id number]      PT SHORT TERM GOAL #1   Title  patient to be independent with initial HEP    Status  On-going      PT SHORT TERM GOAL #2   Title  patient to improve R ankle DF PROM 0 degrees (neutral) to allow for improved functional mobility  Status  On-going        PT Long Term Goals - 12/26/16 [id number]      PT LONG TERM GOAL #1   Title  patient to be independent with advanced HEP    Status  On-going      PT LONG TERM GOAL #2   Title  patient to demonstrate good heel toe gait pattern through WB status as MD allows     Status  On-going      PT LONG TERM GOAL #3   Title  patient to improve R ankle AROM into DF to >/= 5 degrees to allow for improved functional mobility    Status  On-going      PT LONG TERM GOAL #4   Title  patient to demonstrate stair navigation with single handrail with WB status as MD allows without instability or LOB    Status  On-going            Plan - 01/19/17 1132    Clinical Impression Statement  Patient doing well with 50% WB. Continuing to use single crutch for all daily activities. Attempts very short distances, 15 feet or less, with no assistive device, but has significant limp due to lack of confidence. Patient able to walk with normal gait pattern and no assistive device with support nearby, but does not feel comfortable out in open space. Will continue to progress  strengthening, WB and normal gait pattern to patient tolerance.     PT Treatment/Interventions  ADLs/Self Care Home Management;Cryotherapy;Electrical Stimulation;Moist Heat;Ultrasound;Passive range of motion;Vasopneumatic Device;Gait training;Stair training;Functional mobility training;Therapeutic activities;Therapeutic exercise;Patient/family education;Balance training;Neuromuscular re-education;Manual techniques;Scar mobilization;Dry needling;Taping    Consulted and Agree with Plan of Care  Patient       Patient will benefit from skilled therapeutic intervention in order to improve the following deficits and impairments:  Abnormal gait, Decreased activity tolerance, Decreased strength, Decreased balance, Decreased mobility, Difficulty walking, Decreased range of motion, Increased edema  Visit Diagnosis: Stiffness of right ankle, not elsewhere classified  Difficulty in walking, not elsewhere classified  Other abnormalities of gait and mobility  Muscle weakness (generalized)     Problem List There are no active problems to display for this patient.   Emerson MonteKimberly Briann Sarchet, SPT 01/19/17 11:36 AM   [facility] [place] [address]  [address] [place], [place], [address] Phone: [phone]   Fax:  [phone]  Name: Jeremy HongSteve Nielsen MRN: [id number] Date of Birth: 04/11/1975

## 2017-01-23 ENCOUNTER — Ambulatory Visit: Payer: 59 | Attending: Orthopaedic Surgery

## 2017-01-23 DIAGNOSIS — R2689 Other abnormalities of gait and mobility: Secondary | ICD-10-CM | POA: Diagnosis present

## 2017-01-23 DIAGNOSIS — M25671 Stiffness of right ankle, not elsewhere classified: Secondary | ICD-10-CM | POA: Diagnosis present

## 2017-01-23 DIAGNOSIS — M6281 Muscle weakness (generalized): Secondary | ICD-10-CM

## 2017-01-23 DIAGNOSIS — R262 Difficulty in walking, not elsewhere classified: Secondary | ICD-10-CM | POA: Diagnosis present

## 2017-01-23 NOTE — Therapy (Signed)
[facility] [place] [address]  [address] [place], [place], [address] Phone: [phone]   Fax:  [phone]  Physical Therapy Treatment  Patient Details  Name: Jeremy HongSteve Nielsen MRN: [id number] Date of Birth: 10/22/1975 Referring Provider: Dr. Annell GreeningMark Yates   Encounter Date: 01/23/2017  PT End of Session - 01/23/17 1404    Visit Number  7    Number of Visits  12    Date for PT Re-Evaluation  01/30/17    PT Start Time  1400    PT Stop Time  1443    PT Time Calculation (min)  43 min    Activity Tolerance  Patient tolerated treatment well    Behavior During Therapy  [facility]WFL for tasks assessed/performed       Past Medical History:  Diagnosis Date  . Achilles tendon rupture 09/2016   right  . Dental crowns present     Past Surgical History:  Procedure Laterality Date  . ACHILLES TENDON SURGERY Right 10/25/2016   Procedure: RIGHT ACHILLES TENDON REPAIR;  Surgeon: Eldred MangesYates, Mark C, MD;  Location: [facility];  Service: Orthopedics;  Laterality: Right;  Achilles Tendon  . NO PAST SURGERIES      There were no vitals filed for this visit.  Subjective Assessment - 01/23/17 1403    Subjective  Pt. seen to start treatment ambulating without crutches and reports he has been walking without crutch Since Sunday without pain.      Pertinent History  none    Patient Stated Goals  improve ankle ROM and mobility    Currently in Pain?  No/denies    Multiple Pain Sites  No         [facility] PT Assessment - 01/23/17 1411      PROM   PROM Assessment Site  Ankle    Right/Left Ankle  Right    Right Ankle Dorsiflexion  4                  [facility] Adult PT Treatment/Exercise - 01/23/17 1411      Ambulation/Gait   Ambulation/Gait  Yes    Ambulation/Gait Assistance  5: Supervision    Ambulation Distance (Feet)  150 Feet    Assistive device  None    Gait Comments  Working on even wt. shift and stance time       Neuro Re-ed     Neuro Re-ed Details   Staggered stance forward/back wt. shift on blue foam ovals (R LE forwards) x 20 reps; intermittent UE support on chair; to encourage R LE wt. bearing       Knee/Hip Exercises: Stretches   Gastroc Stretch  --    Theme park managerGastroc Stretch Limitations  --    Soleus Stretch  --    Soleus Stretch Limitations  --      Knee/Hip Exercises: Aerobic   Recumbent Bike  L2 x 6 min.      Knee/Hip Exercises: Standing   Forward Lunges  Right;Left;10 reps    Forward Lunges Limitations  counter    Hip Extension  Right;Left;15 reps;1 set    Extension Limitations  Fitter (2 blue bands); 1 chair support     Functional Squat  15 reps    Functional Squat Limitations  min cueing for wt. shift over R  TM support       Knee/Hip Exercises: Supine   Other Supine Knee/Hip Exercises  Bridge + HS curl with heels on peanut p-ball x 15  reps       Ankle Exercises: Stretches   Soleus Stretch  2 reps;30 seconds    Soleus Stretch Limitations  leaning into wall     Gastroc Stretch  2 reps;30 seconds    Gastroc Stretch Limitations  leaning into wall                PT Short Term Goals - 01/23/17 1416      PT SHORT TERM GOAL #1   Title  patient to be independent with initial HEP    Status  Achieved      PT SHORT TERM GOAL #2   Title  patient to improve R ankle DF PROM 0 degrees (neutral) to allow for improved functional mobility    Status  Achieved        PT Long Term Goals - 12/26/16 [id number]      PT LONG TERM GOAL #1   Title  patient to be independent with advanced HEP    Status  On-going      PT LONG TERM GOAL #2   Title  patient to demonstrate good heel toe gait pattern through WB status as MD allows     Status  On-going      PT LONG TERM GOAL #3   Title  patient to improve R ankle AROM into DF to >/= 5 degrees to allow for improved functional mobility    Status  On-going      PT LONG TERM GOAL #4   Title  patient to demonstrate stair navigation with single handrail with WB  status as MD allows without instability or LOB    Status  On-going            Plan - 01/23/17 1407    Clinical Impression Statement  Pt. seen ambulating into treatment without crutch and reports he has been ambulating without AD or boot since Sunday without issue.  Pt. strongly encouraged to limit his walking without AD to short distances in home.  Pt. with improved wt. shift over R LE with staggered stance activities today in therex and seems to be improving gait pattern without AD.  Worked on even stance time and upright posture with gait without AD today as pt. with shortened stance time on R.  Able to demo 4 dg PROM DF today meeting STG.  Progressing well toward goals.      PT Treatment/Interventions  ADLs/Self Care Home Management;Cryotherapy;Electrical Stimulation;Moist Heat;Ultrasound;Passive range of motion;Vasopneumatic Device;Gait training;Stair training;Functional mobility training;Therapeutic activities;Therapeutic exercise;Patient/family education;Balance training;Neuromuscular re-education;Manual techniques;Scar mobilization;Dry needling;Taping    Consulted and Agree with Plan of Care  Patient       Patient will benefit from skilled therapeutic intervention in order to improve the following deficits and impairments:  Abnormal gait, Decreased activity tolerance, Decreased strength, Decreased balance, Decreased mobility, Difficulty walking, Decreased range of motion, Increased edema  Visit Diagnosis: Stiffness of right ankle, not elsewhere classified  Difficulty in walking, not elsewhere classified  Other abnormalities of gait and mobility  Muscle weakness (generalized)     Problem List There are no active problems to display for this patient.   Kermit BaloMicah Diania Co, PTA 01/23/17 6:20 PM  [facility] [place] [address]  [address] [place], [place], [address] Phone: [phone]   Fax:  [phone]  Name: Jeremy HongSteve  Briddell MRN: [id number] Date of Birth: 09/14/1975

## 2017-01-26 ENCOUNTER — Ambulatory Visit: Payer: 59 | Admitting: Physical Therapy

## 2017-01-26 ENCOUNTER — Encounter: Payer: Self-pay | Admitting: Physical Therapy

## 2017-01-26 DIAGNOSIS — R262 Difficulty in walking, not elsewhere classified: Secondary | ICD-10-CM

## 2017-01-26 DIAGNOSIS — M6281 Muscle weakness (generalized): Secondary | ICD-10-CM

## 2017-01-26 DIAGNOSIS — R2689 Other abnormalities of gait and mobility: Secondary | ICD-10-CM

## 2017-01-26 DIAGNOSIS — M25671 Stiffness of right ankle, not elsewhere classified: Secondary | ICD-10-CM | POA: Diagnosis not present

## 2017-01-26 NOTE — Therapy (Signed)
[facility] [place] [address]  [address] [place], [place], [address] Phone: [phone]   Fax:  [phone]  Physical Therapy Treatment  Patient Details  Name: Jeremy HongSteve Nielsen MRN: [id number] Date of Birth: 09/24/1975 Referring Provider: Dr. Annell GreeningMark Yates   Encounter Date: 01/26/2017  PT End of Session - 01/26/17 0940    Visit Number  8    Number of Visits  12    Date for PT Re-Evaluation  01/30/17    PT Start Time  0846    PT Stop Time  0931    PT Time Calculation (min)  45 min    Activity Tolerance  Patient tolerated treatment well    Behavior During Therapy  [facility]WFL for tasks assessed/performed       Past Medical History:  Diagnosis Date  . Achilles tendon rupture 09/2016   right  . Dental crowns present     Past Surgical History:  Procedure Laterality Date  . ACHILLES TENDON SURGERY Right 10/25/2016   Procedure: RIGHT ACHILLES TENDON REPAIR;  Surgeon: Eldred MangesYates, Mark C, MD;  Location: [facility];  Service: Orthopedics;  Laterality: Right;  Achilles Tendon  . NO PAST SURGERIES      There were no vitals filed for this visit.  Subjective Assessment - 01/26/17 0847    Subjective  Patient reporting knee pain from last week has decreased. Feels walking has improved and weight bearing on R LE is more consistent. Patient stating he felt challenged at last therapy session, but felt good about everything he did. Now able to put on pants while standing only on R LE.    Currently in Pain?  No/denies    Multiple Pain Sites  No                      [facility] Adult PT Treatment/Exercise - 01/26/17 0843      High Level Balance   High Level Balance Activities  Other (comment)    High Level Balance Comments  narrow BOS, tandem R/L on airex; x15 ball toss at Lexmark Internationalrebounder      Knee/Hip Exercises: Aerobic   Recumbent Bike  L3 x 6 min      Knee/Hip Exercises: Machines for Strengthening   Cybex Knee Extension  B con/ R  ecc; 15 reps; 25#    Cybex Knee Flexion  B con/ R ecc; 15 reps; 25#      Knee/Hip Exercises: Standing   Forward Lunges  Right;Left;10 reps much improved accepting weight through R LE    Forward Lunges Limitations  at counter    Other Standing Knee Exercises  side stepping; yellow tband; 5 x 1215ft each way      Ankle Exercises: Standing   Heel Walk (Round Trip)  5 laps at counter; min. UE support    Toe Walk (Round Trip)  attempted - pt unable    Other Standing Ankle Exercises  weight shifting R/L on BOSU (ball down) - 2 x 30 sec               PT Short Term Goals - 01/23/17 1416      PT SHORT TERM GOAL #1   Title  patient to be independent with initial HEP    Status  Achieved      PT SHORT TERM GOAL #2   Title  patient to improve R ankle DF PROM 0 degrees (neutral) to allow for improved functional mobility  Status  Achieved        PT Long Term Goals - 12/26/16 [id number]      PT LONG TERM GOAL #1   Title  patient to be independent with advanced HEP    Status  On-going      PT LONG TERM GOAL #2   Title  patient to demonstrate good heel toe gait pattern through WB status as MD allows     Status  On-going      PT LONG TERM GOAL #3   Title  patient to improve R ankle AROM into DF to >/= 5 degrees to allow for improved functional mobility    Status  On-going      PT LONG TERM GOAL #4   Title  patient to demonstrate stair navigation with single handrail with WB status as MD allows without instability or LOB    Status  On-going            Plan - 01/26/17 0941    Clinical Impression Statement  Continuing to progress patient strength and balance activities with good tolerance. Patient noting improvements in ambulation and other activities at home. Patient gaining confidence in R LE weight bearing with each session, however still hesitant with some exercises and lacking significant gastroc/soleus strength. Will continue to progress to patient tolerance.    PT  Treatment/Interventions  ADLs/Self Care Home Management;Cryotherapy;Electrical Stimulation;Moist Heat;Ultrasound;Passive range of motion;Vasopneumatic Device;Gait training;Stair training;Functional mobility training;Therapeutic activities;Therapeutic exercise;Patient/family education;Balance training;Neuromuscular re-education;Manual techniques;Scar mobilization;Dry needling;Taping    Consulted and Agree with Plan of Care  Patient       Patient will benefit from skilled therapeutic intervention in order to improve the following deficits and impairments:  Abnormal gait, Decreased activity tolerance, Decreased strength, Decreased balance, Decreased mobility, Difficulty walking, Decreased range of motion, Increased edema  Visit Diagnosis: Stiffness of right ankle, not elsewhere classified  Difficulty in walking, not elsewhere classified  Other abnormalities of gait and mobility  Muscle weakness (generalized)     Problem List There are no active problems to display for this patient.   Emerson MonteKimberly Saveon Plant, SPT 01/26/17 9:45 AM    [facility] [place] [address]  [address] [place], [place], [address] Phone: [phone]   Fax:  [phone]  Name: Jeremy HongSteve Nielsen MRN: [id number] Date of Birth: 03/22/1975

## 2017-01-30 ENCOUNTER — Ambulatory Visit: Payer: 59

## 2017-01-30 DIAGNOSIS — R2689 Other abnormalities of gait and mobility: Secondary | ICD-10-CM

## 2017-01-30 DIAGNOSIS — M6281 Muscle weakness (generalized): Secondary | ICD-10-CM

## 2017-01-30 DIAGNOSIS — M25671 Stiffness of right ankle, not elsewhere classified: Secondary | ICD-10-CM | POA: Diagnosis not present

## 2017-01-30 DIAGNOSIS — R262 Difficulty in walking, not elsewhere classified: Secondary | ICD-10-CM

## 2017-01-30 NOTE — Therapy (Signed)
Dictation #1 [id number] [facility] [place] [address]  [address] [place], [place], [address] Phone: [phone]   Fax:  [phone]  Physical Therapy Treatment  Patient Details  Name: Jeremy Nielsen MRN: [id number] Date of Birth: 01/27/1976 Referring Provider: Dr. Annell GreeningMark Yates   Encounter Date: 01/30/2017  PT End of Session - 01/30/17 1454    Visit Number  9    Number of Visits  12    Date for PT Re-Evaluation  01/30/17    PT Start Time  1447    PT Stop Time  1529    PT Time Calculation (min)  42 min    Activity Tolerance  Patient tolerated treatment well    Behavior During Therapy  [facility]FL for tasks assessed/performed       Past Medical History:  Diagnosis Date  . Achilles tendon rupture 09/2016   right  . Dental crowns present     Past Surgical History:  Procedure Laterality Date  . ACHILLES TENDON SURGERY Right 10/25/2016   Procedure: RIGHT ACHILLES TENDON REPAIR;  Surgeon: Eldred MangesYates, Mark C, MD;  Location: [facility];  Service: Orthopedics;  Laterality: Right;  Achilles Tendon  . NO PAST SURGERIES      There were no vitals filed for this visit.  Subjective Assessment - 01/30/17 1450    Subjective  Pt. reporting he no longer feels need for crutches at home and has been feeling more comfortable walking in tennis shoes.      Pertinent History  none    Patient Stated Goals  improve ankle ROM and mobility    Currently in Pain?  No/denies    Multiple Pain Sites  No                      [facility] Adult PT Treatment/Exercise - 01/30/17 1454      Ambulation/Gait   Stairs  Yes    Stairs Assistance  6: Modified independent (Device/Increase time)    Stair Management Technique  One rail Right    Number of Stairs  13    Gait Comments  Alternating ascending with good stability; limited stability with alternating descending likely due to limited R DF ROM      Neuro Re-ed    Neuro  Re-ed Details   R SLS on rebounder 3 x 30 sec; light UE support       Knee/Hip Exercises: Aerobic   Nustep  Lvl 7, 6 min       Knee/Hip Exercises: Standing   Heel Raises  Both;15 reps    Heel Raises Limitations  B con/R wt. shift for eccentic     Forward Lunges  Right;Left;10 reps    Forward Lunges Limitations  in doorframe    Side Lunges  Right;Left;10 reps;3 seconds    Side Lunges Limitations  TRX    Step Down  Right;Step Height: 4";10 reps;1 set;Hand Hold: 1    Step Down Limitations  at counter     Functional Squat  15 reps    Functional Squat Limitations  TRX + heel raise at bottom of squat     SLS  R SLS pallof press with red x 10 reps each way     Rebounder  --      Ankle Exercises: Stretches   Soleus Stretch  2 reps;30 seconds    Soleus Stretch Limitations  leaning into wall     Gastroc Stretch  2 reps;30 seconds  Gastroc Stretch Limitations  leaning into wall                PT Short Term Goals - 01/23/17 1416      PT SHORT TERM GOAL #1   Title  patient to be independent with initial HEP    Status  Achieved      PT SHORT TERM GOAL #2   Title  patient to improve R ankle DF PROM 0 degrees (neutral) to allow for improved functional mobility    Status  Achieved        PT Long Term Goals - 12/26/16 [id number]      PT LONG TERM GOAL #1   Title  patient to be independent with advanced HEP    Status  On-going      PT LONG TERM GOAL #2   Title  patient to demonstrate good heel toe gait pattern through WB status as MD allows     Status  On-going      PT LONG TERM GOAL #3   Title  patient to improve R ankle AROM into DF to >/= 5 degrees to allow for improved functional mobility    Status  On-going      PT LONG TERM GOAL #4   Title  patient to demonstrate stair navigation with single handrail with WB status as MD allows without instability or LOB    Status  On-going            Plan - 01/30/17 1538    Clinical Impression Statement  Brett CanalesSteve doing well  today reporting he is feeling more comfortable ambulating in tennis shoes and has not been using crutch.  Demonstrating limited stability descending stairs in alternating pattern today likely due to remaining DF ROM deficit.  Incorporated some low-level single leg stance balance activities today with pt. verbalizing good challenge.  Pt. tolerated all activities in treatment well today without pain.  Will continue to benefit from further skilled therapy to maximize functional strength and ROM.      PT Treatment/Interventions  ADLs/Self Care Home Management;Cryotherapy;Electrical Stimulation;Moist Heat;Ultrasound;Passive range of motion;Vasopneumatic Device;Gait training;Stair training;Functional mobility training;Therapeutic activities;Therapeutic exercise;Patient/family education;Balance training;Neuromuscular re-education;Manual techniques;Scar mobilization;Dry needling;Taping    Consulted and Agree with Plan of Care  Patient       Patient will benefit from skilled therapeutic intervention in order to improve the following deficits and impairments:  Abnormal gait, Decreased activity tolerance, Decreased strength, Decreased balance, Decreased mobility, Difficulty walking, Decreased range of motion, Increased edema  Visit Diagnosis: Stiffness of right ankle, not elsewhere classified  Difficulty in walking, not elsewhere classified  Other abnormalities of gait and mobility  Muscle weakness (generalized)     Problem List There are no active problems to display for this patient.   Kermit BaloMicah Demetreus Lothamer, PTA 01/30/17 3:42 PM  [facility] [place] [address]  [address] [place], [place], [address] Phone: [phone]   Fax:  [phone]  Name: Jeremy Nielsen MRN: [id number] Date of Birth: 05/25/1975

## 2017-02-01 ENCOUNTER — Ambulatory Visit: Payer: 59

## 2017-02-01 DIAGNOSIS — M25671 Stiffness of right ankle, not elsewhere classified: Secondary | ICD-10-CM

## 2017-02-01 DIAGNOSIS — R262 Difficulty in walking, not elsewhere classified: Secondary | ICD-10-CM

## 2017-02-01 DIAGNOSIS — M6281 Muscle weakness (generalized): Secondary | ICD-10-CM

## 2017-02-01 DIAGNOSIS — R2689 Other abnormalities of gait and mobility: Secondary | ICD-10-CM

## 2017-02-01 NOTE — Therapy (Signed)
[facility] [address]  [place], [place], [address] Phone: [phone]   Fax:  [phone]  Physical Therapy Treatment  Patient Details  Name: Jeremy Nielsen MRN: [id number] Date of Birth: Oct 25, 1975 Referring Provider: Dr. Rodell Perna   Encounter Date: 02/01/2017  PT End of Session - 02/01/17 0847    Visit Number  10    Number of Visits  12    Date for PT Re-Evaluation  01/30/17    PT Start Time  0845    PT Stop Time  0925    PT Time Calculation (min)  40 min    Activity Tolerance  Patient tolerated treatment well    Behavior During Therapy  [facility] for tasks assessed/performed       Past Medical History:  Diagnosis Date  . Achilles tendon rupture 09/2016   right  . Dental crowns present     Past Surgical History:  Procedure Laterality Date  . ACHILLES TENDON SURGERY Right 10/25/2016   Procedure: RIGHT ACHILLES TENDON REPAIR;  Surgeon: Marybelle Killings, MD;  Location: [address];  Service: Orthopedics;  Laterality: Right;  Achilles Tendon  . NO PAST SURGERIES      There were no vitals filed for this visit.  Subjective Assessment - 02/01/17 0847    Subjective  Pt. reporting some thigh muscular sorness following last visit which has improved some.  Pt. able to ambulate 15 min around downtown [facility] without AD or soreness     Pertinent History  none    Patient Stated Goals  improve ankle ROM and mobility    Currently in Pain?  No/denies    Multiple Pain Sites  No         [facility] PT Assessment - 02/01/17 0901      Observation/Other Assessments   Focus on Therapeutic Outcomes (FOTO)   62% (38% limitation)      AROM   AROM Assessment Site  Ankle    Right/Left Ankle  Right    Right Ankle Dorsiflexion  12    Right Ankle Plantar Flexion  42    Right Ankle Inversion  33    Right Ankle Eversion  18                  [facility] Adult PT Treatment/Exercise - 02/01/17 0901       Ambulation/Gait   Ambulation/Gait  Yes    Ambulation/Gait Assistance  5: Supervision    Ambulation/Gait Assistance Details  Still with slight lateral trunk lean; pt. able to self-correct with cueing    Ambulation Distance (Feet)  90 Feet    Assistive device  None    Stairs  Yes    Stairs Assistance  6: Modified independent (Device/Increase time)    Stair Management Technique  One rail Right    Number of Stairs  13    Gait Comments  Alternating ascending with good stability; limited stability with alternating descending likely due to limited R DF ROM requiring pt. ER at R with "side stepping" for alternating pattern.  Pt. verbalizing more comfortable with step-to pattern still descending stairs       Knee/Hip Exercises: Standing   Hip Flexion  Left;Right;10 reps;Knee straight;Stengthening    Hip Flexion Limitations  red TB at ankles; 2 ski poles     Hip ADduction  Right;Left;10 reps;Strengthening    Hip ADduction Limitations  red TB at ankles; 2 ski poles     Hip  Abduction  Right;Left;10 reps;Knee straight;Stengthening    Abduction Limitations  red TB at ankles; 2 ski poles     Hip Extension  Right;Left;10 reps;Knee straight    Extension Limitations  red TB at ankles; 2 ski poles     Lateral Step Up  Right;Left;10 reps;Step Height: 6";Hand Hold: 2    Forward Step Up  Right;Step Height: 8";15 reps;Hand Hold: 1    Forward Step Up Limitations  1 ski pole    Step Down  Right;10 reps;1 set;Hand Hold: 2;Step Height: 6"    Step Down Limitations  2 ski poles mirror feedback to promote upright posture, even hips, no ER      Knee/Hip Exercises: Supine   Other Supine Knee/Hip Exercises  Bridge + HS curl with heels on peanut p-ball x 20 reps                PT Short Term Goals - 01/23/17 1416      PT SHORT TERM GOAL #1   Title  patient to be independent with initial HEP    Status  Achieved      PT SHORT TERM GOAL #2   Title  patient to improve R ankle DF PROM 0 degrees (neutral) to  allow for improved functional mobility    Status  Achieved        PT Long Term Goals - 02/01/17 0912      PT LONG TERM GOAL #1   Title  patient to be independent with advanced HEP    Status  Partially Met met for current       PT LONG TERM GOAL #2   Title  patient to demonstrate good heel toe gait pattern through WB status as MD allows     Status  On-going      PT LONG TERM GOAL #3   Title  patient to improve R ankle AROM into DF to >/= 5 degrees to allow for improved functional mobility    Status  Achieved      PT LONG TERM GOAL #4   Title  patient to demonstrate stair navigation with single handrail with WB status as MD allows without instability or LOB    Status  On-going            Plan - 02/01/17 0848    Clinical Impression Statement  Pt. meeting DF AROM goal today however still demonstrating limited control/stability descending stairs in alternating pattern.  Still with tendency for R ER while descending likely due to remaining ROM limitation.  Some work today in treatment with 6" step-down and mirror feedback for proper technique.  Still with tendency for lateral trunk lean with ambulation however able to self-correct with cueing.  Pt. encouraged to self-correct lateral lean with everyday walking and attempt reciprocal pattern descending on stairs as able without ER when not at therapy.  Reports was able to ambulate in downtown [facility] x 15 min without AD or pain limiting a few nights ago.  Pt. progressing well toward goals.    PT Treatment/Interventions  ADLs/Self Care Home Management;Cryotherapy;Electrical Stimulation;Moist Heat;Ultrasound;Passive range of motion;Vasopneumatic Device;Gait training;Stair training;Functional mobility training;Therapeutic activities;Therapeutic exercise;Patient/family education;Balance training;Neuromuscular re-education;Manual techniques;Scar mobilization;Dry needling;Taping    Consulted and Agree with Plan of Care  Patient        Patient will benefit from skilled therapeutic intervention in order to improve the following deficits and impairments:  Abnormal gait, Decreased activity tolerance, Decreased strength, Decreased balance, Decreased mobility, Difficulty walking, Decreased range of motion,  Increased edema  Visit Diagnosis: Stiffness of right ankle, not elsewhere classified  Difficulty in walking, not elsewhere classified  Other abnormalities of gait and mobility  Muscle weakness (generalized)     Problem List There are no active problems to display for this patient.  Bess Harvest, PTA 02/01/17 12:32 PM  [facility] [address]  [place], [place], [address] Phone: [phone]   Fax:  [phone]  Name: Jeremy Nielsen MRN: [id number] Date of Birth: [birth year]

## 2017-02-06 ENCOUNTER — Ambulatory Visit: Payer: 59 | Admitting: Physical Therapy

## 2017-02-07 ENCOUNTER — Ambulatory Visit (INDEPENDENT_AMBULATORY_CARE_PROVIDER_SITE_OTHER): Payer: 59 | Admitting: Orthopaedic Surgery

## 2017-02-09 ENCOUNTER — Other Ambulatory Visit (INDEPENDENT_AMBULATORY_CARE_PROVIDER_SITE_OTHER): Payer: Self-pay | Admitting: Orthopaedic Surgery

## 2017-02-09 ENCOUNTER — Encounter (HOSPITAL_BASED_OUTPATIENT_CLINIC_OR_DEPARTMENT_OTHER): Payer: Self-pay

## 2017-02-09 ENCOUNTER — Ambulatory Visit (HOSPITAL_BASED_OUTPATIENT_CLINIC_OR_DEPARTMENT_OTHER)
Admission: RE | Admit: 2017-02-09 | Discharge: 2017-02-09 | Disposition: A | Discharge status: Home / Self Care | Payer: 59 | Source: Ambulatory Visit | Attending: Surgery | Admitting: Surgery

## 2017-02-09 ENCOUNTER — Ambulatory Visit (INDEPENDENT_AMBULATORY_CARE_PROVIDER_SITE_OTHER): Payer: 59 | Admitting: Orthopaedic Surgery

## 2017-02-09 ENCOUNTER — Ambulatory Visit: Payer: 59

## 2017-02-09 ENCOUNTER — Other Ambulatory Visit: Payer: Self-pay

## 2017-02-09 ENCOUNTER — Encounter (INDEPENDENT_AMBULATORY_CARE_PROVIDER_SITE_OTHER): Payer: Self-pay | Admitting: Orthopaedic Surgery

## 2017-02-09 ENCOUNTER — Emergency Department (HOSPITAL_BASED_OUTPATIENT_CLINIC_OR_DEPARTMENT_OTHER)
Admission: EM | Admit: 2017-02-09 | Discharge: 2017-02-09 | Disposition: A | Discharge status: Home / Self Care | Payer: 59 | Attending: Emergency Medicine | Admitting: Emergency Medicine

## 2017-02-09 VITALS — BP 134/86 | HR 89

## 2017-02-09 DIAGNOSIS — I824Z1 Acute embolism and thrombosis of unspecified deep veins of right distal lower extremity: Secondary | ICD-10-CM | POA: Insufficient documentation

## 2017-02-09 DIAGNOSIS — M25671 Stiffness of right ankle, not elsewhere classified: Secondary | ICD-10-CM | POA: Diagnosis not present

## 2017-02-09 DIAGNOSIS — R262 Difficulty in walking, not elsewhere classified: Secondary | ICD-10-CM

## 2017-02-09 DIAGNOSIS — I82431 Acute embolism and thrombosis of right popliteal vein: Secondary | ICD-10-CM | POA: Insufficient documentation

## 2017-02-09 DIAGNOSIS — M6281 Muscle weakness (generalized): Secondary | ICD-10-CM

## 2017-02-09 DIAGNOSIS — Z9889 Other specified postprocedural states: Secondary | ICD-10-CM

## 2017-02-09 DIAGNOSIS — M79661 Pain in right lower leg: Secondary | ICD-10-CM

## 2017-02-09 DIAGNOSIS — M7989 Other specified soft tissue disorders: Principal | ICD-10-CM

## 2017-02-09 DIAGNOSIS — Z87891 Personal history of nicotine dependence: Secondary | ICD-10-CM | POA: Diagnosis not present

## 2017-02-09 DIAGNOSIS — M79604 Pain in right leg: Secondary | ICD-10-CM | POA: Diagnosis present

## 2017-02-09 DIAGNOSIS — R2689 Other abnormalities of gait and mobility: Secondary | ICD-10-CM

## 2017-02-09 LAB — CBC WITH DIFFERENTIAL/PLATELET
BASOS ABS: 0 10*3/uL (ref 0.0–0.1)
Basophils Relative: 0 %
Eosinophils Absolute: 0.2 10*3/uL (ref 0.0–0.7)
Eosinophils Relative: 3 %
HEMATOCRIT: 41.5 % (ref 39.0–52.0)
HEMOGLOBIN: 13.9 g/dL (ref 13.0–17.0)
LYMPHS PCT: 32 %
Lymphs Abs: 2 10*3/uL (ref 0.7–4.0)
MCH: 28.8 pg (ref 26.0–34.0)
MCHC: 33.5 g/dL (ref 30.0–36.0)
MCV: 86.1 fL (ref 78.0–100.0)
MONO ABS: 0.7 10*3/uL (ref 0.1–1.0)
Monocytes Relative: 11 %
NEUTROS ABS: 3.5 10*3/uL (ref 1.7–7.7)
NEUTROS PCT: 54 %
Platelets: 284 10*3/uL (ref 150–400)
RBC: 4.82 MIL/uL (ref 4.22–5.81)
RDW: 13.8 % (ref 11.5–15.5)
WBC: 6.4 10*3/uL (ref 4.0–10.5)

## 2017-02-09 LAB — COMPREHENSIVE METABOLIC PANEL
ALBUMIN: 4.4 g/dL (ref 3.5–5.0)
ALK PHOS: 65 U/L (ref 38–126)
ALT: 32 U/L (ref 17–63)
AST: 22 U/L (ref 15–41)
Anion gap: 10 (ref 5–15)
BILIRUBIN TOTAL: 0.5 mg/dL (ref 0.3–1.2)
BUN: 16 mg/dL (ref 6–20)
CALCIUM: 9.2 mg/dL (ref 8.9–10.3)
CO2: 25 mmol/L (ref 22–32)
CREATININE: 0.87 mg/dL (ref 0.61–1.24)
Chloride: 103 mmol/L (ref 101–111)
GFR calc Af Amer: >60 mL/min (ref >60)
GLUCOSE: 96 mg/dL (ref 65–99)
Potassium: 3.9 mmol/L (ref 3.5–5.1)
Sodium: 138 mmol/L (ref 135–145)
TOTAL PROTEIN: 8.6 g/dL — AB (ref 6.5–8.1)

## 2017-02-09 LAB — TROPONIN I

## 2017-02-09 MED ORDER — RIVAROXABAN (XARELTO) EDUCATION KIT FOR DVT/PE PATIENTS
PACK | Freq: Once | Status: AC
  Administered 2017-02-09: 19:00:00

## 2017-02-09 MED ORDER — RIVAROXABAN (XARELTO) VTE STARTER PACK (15 & 20 MG): ORAL_TABLET | ORAL | 0 refills | Status: DC

## 2017-02-09 MED ORDER — RIVAROXABAN 15 MG PO TABS
15.0000 mg | ORAL_TABLET | Freq: Two times a day (BID) | ORAL | Status: DC
  Administered 2017-02-09: 15 mg via ORAL
  Filled 2017-02-09: qty 1

## 2017-02-09 NOTE — ED Notes (Signed)
Alert, NAD, calm, interactive, resps e/u, speaking in clear complete sentences, no dyspnea noted, skin W&D, VSS, (denies: pain, sob, nausea, dizziness or visual changes). 

## 2017-02-09 NOTE — Progress Notes (Signed)
   Post-Op Visit Note   Patient: Jeremy HongSteve Shimkus           Date of Birth: 11/25/1975           MRN: [id number] Visit Date: 02/09/2017 PCP: Eather ColasHunter, Megan A, FNP   Assessment & Plan:  Chief Complaint:  Chief Complaint  Patient presents with  . Right Ankle - Routine Post Op  Patient returns. 12 weeks status post right Achilles tendon repair. He has been working with physical therapy. Has been having increased swelling right lower leg along with some calf soreness. Denies chest pain shortness of breath. He admits to some episodes of feeling lightheaded at times during the day and this has been off and on the last several months. Has not discussed this with his primary care physician. He has returned back to work which involves a lot of walking or doing 10 hour days. He is also had to wear steel toed shoe.   Visit Diagnoses:  1. Pain and swelling of lower leg, right   2. S/P Achilles tendon repair     Plan: With patient's Soreness and swelling recommend getting a venous Doppler right lower extremity to rule out DVT. The vascular labs in [place] are unable to get him in for this today. [facility] Our assistant will call there and have the physician call my cell number with result.    Follow-Up Instructions: Return in about 6 weeks (around 03/23/2017).   Orders:  No orders of the defined types were placed in this encounter.  No orders of the defined types were placed in this encounter.   Imaging: No results found.  PMFS History: There are no active problems to display for this patient.  Past Medical History:  Diagnosis Date  . Achilles tendon rupture 09/2016   right  . Dental crowns present     History reviewed. No pertinent family history.  Past Surgical History:  Procedure Laterality Date  . ACHILLES TENDON SURGERY Right 10/25/2016   Procedure: RIGHT ACHILLES TENDON REPAIR;  Surgeon: Eldred MangesYates, Mark C, MD;  Location: [facility];   Service: Orthopedics;  Laterality: Right;  Achilles Tendon  . NO PAST SURGERIES     Social History   Occupational History  . Not on file  Tobacco Use  . Smoking status: Former Smoker    Last attempt to quit: 02/20/2012    Years since quitting: 4.9  . Smokeless tobacco: Never Used  Substance and Sexual Activity  . Alcohol use: Yes    Comment: rarely  . Drug use: No  . Sexual activity: Not on file

## 2017-02-09 NOTE — Therapy (Signed)
[facility] [address]  [place], [place], [address] Phone: [phone]   Fax:  [phone]  Physical Therapy Treatment  Patient Details  Name: Eriel Doyon MRN: [id number] Date of Birth: [birth year] Referring Provider: Dr. Rodell Perna   Encounter Date: 02/09/2017  PT End of Session - 02/09/17 0801    Visit Number  11    Number of Visits  12    Date for PT Re-Evaluation  01/30/17    PT Start Time  0752    PT Stop Time  0842    PT Time Calculation (min)  50 min    Activity Tolerance  Patient tolerated treatment well    Behavior During Therapy  [facility] for tasks assessed/performed       Past Medical History:  Diagnosis Date  . Achilles tendon rupture 09/2016   right  . Dental crowns present     Past Surgical History:  Procedure Laterality Date  . ACHILLES TENDON SURGERY Right 10/25/2016   Procedure: RIGHT ACHILLES TENDON REPAIR;  Surgeon: Marybelle Killings, MD;  Location: [place];  Service: Orthopedics;  Laterality: Right;  Achilles Tendon  . NO PAST SURGERIES      There were no vitals filed for this visit.  Subjective Assessment - 02/09/17 0757    Subjective  Has noticed some R ankle swelling and occasional R knee pain, however has been working long shifts (10 hours days) standing and walking on concrete floors wearing work boots.     Pertinent History  none    Patient Stated Goals  improve ankle ROM and mobility    Currently in Pain?  Yes    Pain Score  1     Pain Location  Knee    Pain Orientation  Right    Pain Descriptors / Indicators  Aching    Pain Type  Acute pain    Aggravating Factors   Walking in work boots, bearing wt.     Pain Relieving Factors  rest     Multiple Pain Sites  No         [facility] PT Assessment - 02/09/17 0815      AROM   AROM Assessment Site  Ankle    Right/Left Ankle  Right    Right Ankle Dorsiflexion  16    Right Ankle Plantar Flexion  49    Right  Ankle Inversion  34    Right Ankle Eversion  18                  [facility] Adult PT Treatment/Exercise - 02/09/17 0803      Ambulation/Gait   Ambulation/Gait  Yes    Ambulation/Gait Assistance  5: Supervision    Ambulation/Gait Assistance Details  Still with uneven wt. shift and slight trunk lean however able to self-correct with cueing     Ambulation Distance (Feet)  90 Feet    Assistive device  None    Stairs  Yes    Stairs Assistance  6: Modified independent (Device/Increase time)    Stair Management Technique  No rails    Number of Stairs  13    Gait Comments  Pt. with improved stair navigation technique today able to ascend/descend reciprocally without LOB however still with slight R quad weakness evident on eccentric lowering on descending       Neuro Re-ed    Neuro Re-ed Details   B tandem stance on airex pads with ball toss  x 30 sec each way      Knee/Hip Exercises: Aerobic   Tread Mill  Treadmill: lvl 1.5, 5 min       Knee/Hip Exercises: Machines for Strengthening   Cybex Leg Press  B LE's ; 35# x 15 reps; B con/R ecc 25# x 15 reps     Other Machine  B; Bent knee calf raise on leg press; 15 reps; 25#      Knee/Hip Exercises: Standing   Heel Raises  Both;15 reps    Heel Raises Limitations  B con/R wt. shift for eccentic  UE support on counter     SLS with Vectors  R SLS with opposite LE cone tap 2 x 30 sec; 1 ski pole     Other Standing Knee Exercises  Squat on BOSU ball squat (down) x 15 reps; 2 ski poles     Other Standing Knee Exercises  BOSU ball (down) side rocking x 15 reps each side       Ankle Exercises: Standing   Other Standing Ankle Exercises  Side stepping, Monster walk with green TB at forefoot x 30 ft each way       Ankle Exercises: Stretches   Gastroc Stretch  2 reps;30 seconds    Gastroc Stretch Limitations  prostretch               PT Short Term Goals - 01/23/17 1416      PT SHORT TERM GOAL #1   Title  patient to be independent  with initial HEP    Status  Achieved      PT SHORT TERM GOAL #2   Title  patient to improve R ankle DF PROM 0 degrees (neutral) to allow for improved functional mobility    Status  Achieved        PT Long Term Goals - 02/09/17 0904      PT LONG TERM GOAL #1   Title  patient to be independent with advanced HEP    Status  Partially Met met for current       PT LONG TERM GOAL #2   Title  patient to demonstrate good heel toe gait pattern through WB status as MD allows     Status  On-going Pt. still with uneven stance time B and slight trunk lean       PT LONG TERM GOAL #3   Title  patient to improve R ankle AROM into DF to >/= 5 degrees to allow for improved functional mobility    Status  Achieved      PT LONG TERM GOAL #4   Title  patient to demonstrate stair navigation with single handrail with WB status as MD allows without instability or LOB    Status  Achieved            Plan - 02/09/17 0803    Clinical Impression Statement  Jakeel making good progress with therapy thus far.  Still with some hesitation with wt. acceptance on R with standing strengthening activities in treatment however has been ambulating without AD or boot for a few weeks now.  Does still require cueing for even wt. shift and upright posture avoiding lateral trunk lean with ambulation.  Primary complaint today was low-level R knee pain and some R ankle swelling which he attributes to working three days in a row last week standing/walking on concrete floors in work boots.  Stair navigation technique improved today with reciprocal pattern and only slight R quad  instability.  Good progress with ROM today with DF AROM now at 16 dg.  Ranier progressing well toward therapy goals and will continue to benefit from further skilled therapy to normalize walking pattern and improve functional mobility.      PT Treatment/Interventions  ADLs/Self Care Home Management;Cryotherapy;Electrical Stimulation;Moist  Heat;Ultrasound;Passive range of motion;Vasopneumatic Device;Gait training;Stair training;Functional mobility training;Therapeutic activities;Therapeutic exercise;Patient/family education;Balance training;Neuromuscular re-education;Manual techniques;Scar mobilization;Dry needling;Taping    Consulted and Agree with Plan of Care  Patient       Patient will benefit from skilled therapeutic intervention in order to improve the following deficits and impairments:  Abnormal gait, Decreased activity tolerance, Decreased strength, Decreased balance, Decreased mobility, Difficulty walking, Decreased range of motion, Increased edema  Visit Diagnosis: Stiffness of right ankle, not elsewhere classified  Difficulty in walking, not elsewhere classified  Other abnormalities of gait and mobility  Muscle weakness (generalized)     Problem List There are no active problems to display for this patient.   Bess Harvest, PTA 02/09/17 2:41 PM  [facility] [address]  [place], [place], [address] Phone: [phone]   Fax:  [phone]  Name: [place] Merida MRN: [id number] Date of Birth: December 08, 1975

## 2017-02-09 NOTE — ED Triage Notes (Signed)
Pt sent drom US with dx of right LE DVT-NAD-steady gait

## 2017-02-10 NOTE — ED Provider Notes (Signed)
[facility] EMERGENCY DEPARTMENT Provider Note   CSN: [id number] Arrival date & time: 02/09/17  1801     History   Chief Complaint Chief Complaint  Patient presents with  . Leg Pain    HPI Jeremy Nielsen is a 41 y.o. male.  HPI   41yo male with history of achilles tendon repair 12 weeks ago presents from [facility] [employer] [facility] with concern for right lower extremity pain and swelling for DVT rule out.  Reports he had been in a cast and not weightbearing for several weeks. Is now weight bearing, but noted increasing pain and swelling to right lower extremity. Not having significant pain. Reports came at request of his orthopedic provider.  Denies CP and dyspnea.  Has had lightheadedness, intermittent episodes which happen spontaneously.  Has been happening over months but noticing more in last 2 weeks.  No fever, cough, no black or bloody stools.  No recent illness.    Past Medical History:  Diagnosis Date  . Achilles tendon rupture 09/2016   right  . Dental crowns present     There are no active problems to display for this patient.   Past Surgical History:  Procedure Laterality Date  . ACHILLES TENDON SURGERY Right 10/25/2016   Procedure: RIGHT ACHILLES TENDON REPAIR;  Surgeon: Marybelle Killings, MD;  Location: [place];  Service: Orthopedics;  Laterality: Right;  Achilles Tendon  . NO PAST SURGERIES         Home Medications    Prior to Admission medications   Medication Sig Start Date End Date Taking? Authorizing Provider  Rivaroxaban 15 & 20 MG TBPK Take as directed on package: Start with one '15mg'$  tablet by mouth twice a day with food. On Day 22, switch to one '20mg'$  tablet once a day with food. 02/09/17   Gareth Morgan, MD    Family History No family history on file.  Social History Social History   Tobacco Use  . Smoking status: Former Smoker    Last attempt to quit: 02/20/2012    Years since quitting: 4.9  . Smokeless tobacco:  Never Used  Substance Use Topics  . Alcohol use: Yes    Comment: rarely  . Drug use: No     Allergies   Patient has no known allergies.   Review of Systems Review of Systems  Constitutional: Negative for fever.  HENT: Negative for sore throat.   Eyes: Negative for visual disturbance.  Respiratory: Negative for shortness of breath.   Cardiovascular: Positive for leg swelling. Negative for chest pain.  Gastrointestinal: Negative for abdominal pain, blood in stool, diarrhea, nausea and vomiting.  Genitourinary: Negative for difficulty urinating.  Musculoskeletal: Negative for back pain and neck stiffness.  Skin: Negative for rash.  Neurological: Positive for light-headedness. Negative for syncope and headaches.     Physical Exam Updated Vital Signs BP 127/85   Pulse 79   Temp 98.2 F (36.8 C) (Oral)   Resp (!) 22   Ht '5\' 6"'$  (1.676 m)   Wt 83.9 kg (185 lb)   SpO2 96%   BMI 29.86 kg/m   Physical Exam  Constitutional: He is oriented to person, place, and time. He appears well-developed and well-nourished. No distress.  HENT:  Head: Normocephalic and atraumatic.  Eyes: Conjunctivae and EOM are normal.  Neck: Normal range of motion.  Cardiovascular: Normal rate, regular rhythm, normal heart sounds and intact distal pulses. Exam reveals no gallop and no friction rub.  No murmur heard. Pulmonary/Chest:  Effort normal and breath sounds normal. No respiratory distress. He has no wheezes. He has no rales.  Abdominal: Soft. He exhibits no distension. There is no tenderness. There is no guarding.  Musculoskeletal: He exhibits edema (RLE mild edema, swelling).  Neurological: He is alert and oriented to person, place, and time.  Skin: Skin is warm and dry. He is not diaphoretic.  Nursing note and vitals reviewed.    ED Treatments / Results  Labs (all labs ordered are listed, but only abnormal results are displayed) Labs Reviewed  COMPREHENSIVE METABOLIC PANEL - Abnormal;  Notable for the following components:      Result Value   Total Protein 8.6 (*)    All other components within normal limits  CBC WITH DIFFERENTIAL/PLATELET  TROPONIN I    EKG  EKG Interpretation None       Radiology US Venous Img Lower Unilateral Right  Result Date: 02/09/2017 CLINICAL DATA:  Prolonged right ankle edema after right Achilles tendon repair in September. EXAM: RIGHT LOWER EXTREMITY VENOUS DOPPLER ULTRASOUND TECHNIQUE: Gray-scale sonography with graded compression, as well as color Doppler and duplex ultrasound were performed to evaluate the lower extremity deep venous systems from the level of the common femoral vein and including the common femoral, femoral, profunda femoral, popliteal and calf veins including the posterior tibial, peroneal and gastrocnemius veins when visible. The superficial great saphenous vein was also interrogated. Spectral Doppler was utilized to evaluate flow at rest and with distal augmentation maneuvers in the common femoral, femoral and popliteal veins. COMPARISON:  None. FINDINGS: Contralateral Common Femoral Vein: Respiratory phasicity is normal and symmetric with the symptomatic side. No evidence of thrombus. Normal compressibility. Common Femoral Vein: No evidence of thrombus. Normal compressibility, respiratory phasicity and response to augmentation. Saphenofemoral Junction: No evidence of thrombus. Normal compressibility and flow on color Doppler imaging. Profunda Femoral Vein: No evidence of thrombus. Normal compressibility and flow on color Doppler imaging. Femoral Vein: No evidence of thrombus. Normal compressibility, respiratory phasicity and response to augmentation. Popliteal Vein: Nonocclusive thrombus present. Calf Veins: The peroneal vein at the level of the mid calf appears occluded. Posterior tibial veins are occluded at the mid calf but appear open at the ankle. Superficial Great Saphenous Vein: No evidence of thrombus. Normal  compressibility. Venous Reflux:  None. Other Findings: Nonocclusive thrombus identified in a gastrocnemius vein in the calf. IMPRESSION: Deep venous thrombosis identified in the right lower extremity at the level of the popliteal, peroneal and posterior tibial veins. There also is nonocclusive thrombus in the gastrocnemius vein. Thrombus is nonocclusive at the level of the popliteal vein and occlusive in segments of posterior tibial and peroneal veins. Electronically Signed   By: Aletta Edouard M.D.   On: 02/09/2017 17:36    Procedures Procedures (including critical care time)  Medications Ordered in ED Medications  rivaroxaban Jeremy Nielsen) Education Kit for DVT/PE patients ( Does not apply Given 02/09/17 1901)     Initial Impression / Assessment and Plan / ED Course  I have reviewed the triage vital signs and the nursing notes.  Pertinent labs & imaging results that were available during my care of the patient were reviewed by me and considered in my medical decision making (see chart for details).     41yo male presents with concern for right lower extremity swelling and pain.  Found to have DVT on [place].  No chest pain or dyspnea, no signs of PE.  Has lightheadedness. ECG examined by me without acute abnormalities. Labs including  troponin WNL.    No hx of bleeding. Will initiate xarelto for DVT.  Discussed risks in detail. Pt to follow up with PCP. Patient discharged in stable condition with understanding of Nielsen to return.   Final Clinical Impressions(s) / ED Diagnoses   Final diagnoses:  Acute deep vein thrombosis (DVT) of popliteal vein of right lower extremity [facility])    ED Discharge Orders        Ordered    Rivaroxaban 15 & 20 MG TBPK     02/09/17 1851       Gareth Morgan, MD 02/10/17 2048

## 2017-02-14 ENCOUNTER — Ambulatory Visit: Payer: 59

## 2017-02-21 ENCOUNTER — Ambulatory Visit (INDEPENDENT_AMBULATORY_CARE_PROVIDER_SITE_OTHER): Payer: 59 | Admitting: Orthopaedic Surgery

## 2017-02-21 ENCOUNTER — Encounter (INDEPENDENT_AMBULATORY_CARE_PROVIDER_SITE_OTHER): Payer: Self-pay | Admitting: Orthopaedic Surgery

## 2017-02-21 VITALS — BP 122/81 | HR 83 | Ht 66.0 in | Wt 185.0 lb

## 2017-02-21 DIAGNOSIS — Z9889 Other specified postprocedural states: Secondary | ICD-10-CM | POA: Diagnosis not present

## 2017-02-21 DIAGNOSIS — I82441 Acute embolism and thrombosis of right tibial vein: Secondary | ICD-10-CM

## 2017-02-21 NOTE — Progress Notes (Signed)
Office Visit Note   Patient: Jeremy Nielsen           Date of Birth: [birth year]           MRN: [id number] Visit Date: 02/21/2017              Requested by: Eather Colas, FNP [address] [place], [place] [address] PCP: Eather Colas, FNP   Assessment & Plan: Visit Diagnoses:  1. H/O Achilles tendon repair   2. Acute deep vein thrombosis (DVT) of tibial vein of right lower extremity (HCC)     Plan: Patient had calf DVT 3 months post right Achilles tendon repair.  He is on Xarelto at least until March.  He has an appointment with his primary care physician for recheck and they can determine whether he needs a repeat Doppler at that time.  Patient had peroneal and tibial DVT without occlusion.  No DVT in his thigh.  He is continuing his therapy for Strengthening.  He can return as needed here.  Follow-Up Instructions: Return if symptoms worsen or fail to improve.   Orders:  No orders of the defined types were placed in this encounter.  No orders of the defined types were placed in this encounter.     Procedures: No procedures performed   Clinical Data: No additional findings.   Subjective: Chief Complaint  Patient presents with  . Left Leg - Follow-up    HPI patient returns post Doppler positive calf DVT diagnosed by Zonia Kief PA-C.  He states may be is leg swelling might be a little bit less but he still notices some when he is on his leg a lot.  Strength is improving he is amatory without a cane.  Patient had blood work of sent off by his primary care physician to make sure he did not have a hypercoagulable state.  He states he is not heard of the results at this time.  Review of Systems reviewed updated and unchanged other than as mentioned in HPI.   Objective: Vital Signs: BP 122/81   Pulse 83   Ht 5\' 6"  (1.676 m)   Wt 185 lb (83.9 kg)   BMI 29.86 kg/m   Physical Exam  Constitutional: He is oriented to person, place, and time. He  appears well-developed and well-nourished.  HENT:  Head: Normocephalic and atraumatic.  Eyes: EOM are normal. Pupils are equal, round, and reactive to light.  Neck: No tracheal deviation present. No thyromegaly present.  Cardiovascular: Normal rate.  Pulmonary/Chest: Effort normal. He has no wheezes.  Abdominal: Soft. Bowel sounds are normal.  Neurological: He is alert and oriented to person, place, and time.  Skin: Skin is warm and dry. Capillary refill takes less than 2 seconds.  Psychiatric: He has a normal mood and affect. His behavior is normal. Judgment and thought content normal.    Ortho Exam patient has slight right calf atrophy.  Well-healed Achilles tendon repair.  The repair is intact.  He may have trace ankle edema today.  Distal pulses are intact.  Specialty Comments:  No specialty comments available.  Imaging: No results found.   PMFS History: There are no active problems to display for this patient.  Past Medical History:  Diagnosis Date  . Achilles tendon rupture 09/2016   right  . Dental crowns present     History reviewed. No pertinent family history.  Past Surgical History:  Procedure Laterality Date  . ACHILLES TENDON SURGERY Right 10/25/2016  Procedure: RIGHT ACHILLES TENDON REPAIR;  Surgeon: Eldred MangesYates, Addilynne Olheiser C, MD;  Location: [facility];  Service: Orthopedics;  Laterality: Right;  Achilles Tendon  . NO PAST SURGERIES     Social History   Occupational History  . Not on file  Tobacco Use  . Smoking status: Former Smoker    Last attempt to quit: 02/20/2012    Years since quitting: 5.0  . Smokeless tobacco: Never Used  Substance and Sexual Activity  . Alcohol use: Yes    Comment: rarely  . Drug use: No  . Sexual activity: Not on file

## 2017-02-22 ENCOUNTER — Ambulatory Visit: Payer: 59 | Attending: Orthopaedic Surgery

## 2017-02-22 ENCOUNTER — Telehealth (INDEPENDENT_AMBULATORY_CARE_PROVIDER_SITE_OTHER): Payer: Self-pay | Admitting: Orthopaedic Surgery

## 2017-02-22 DIAGNOSIS — M6281 Muscle weakness (generalized): Secondary | ICD-10-CM | POA: Insufficient documentation

## 2017-02-22 DIAGNOSIS — R2689 Other abnormalities of gait and mobility: Secondary | ICD-10-CM | POA: Insufficient documentation

## 2017-02-22 DIAGNOSIS — R262 Difficulty in walking, not elsewhere classified: Secondary | ICD-10-CM | POA: Insufficient documentation

## 2017-02-22 DIAGNOSIS — M25671 Stiffness of right ankle, not elsewhere classified: Secondary | ICD-10-CM | POA: Diagnosis present

## 2017-02-22 NOTE — Telephone Encounter (Signed)
Patient called asking for a 60 day supply of the Xarelto. CB # [phone]

## 2017-02-22 NOTE — Telephone Encounter (Signed)
Ok thanks 

## 2017-02-22 NOTE — Therapy (Signed)
[facility] [address]  [place], [place], [address] Phone: [phone]   Fax:  [phone]  Physical Therapy Treatment  Patient Details  Name: Jeremy Nielsen MRN: [id number] Date of Birth: 16-Feb-1976 Referring Provider: Dr. Rodell Perna   Encounter Date: 02/22/2017  PT End of Session - 02/22/17 0806    Visit Number  12    Number of Visits  12    Date for PT Re-Evaluation  --    PT Start Time  0802    PT Stop Time  0845    PT Time Calculation (min)  43 min    Activity Tolerance  Patient tolerated treatment well    Behavior During Therapy  [facility] for tasks assessed/performed       Past Medical History:  Diagnosis Date  . Achilles tendon rupture 09/2016   right  . Dental crowns present     Past Surgical History:  Procedure Laterality Date  . ACHILLES TENDON SURGERY Right 10/25/2016   Procedure: RIGHT ACHILLES TENDON REPAIR;  Surgeon: Marybelle Killings, MD;  Location: [facility];  Service: Orthopedics;  Laterality: Right;  Achilles Tendon  . NO PAST SURGERIES      There were no vitals filed for this visit.  Subjective Assessment - 02/22/17 1315    Subjective  Pt. reports saw referring MD 12.21.18 for f/u.  MD concerned about R LE swelling and sent pt. to ED for concerns regarding possible DVT.  Pt. testing positive for R LE DVT and now taking Xeralto.  Denies lightheadedness or shortness of breath today.      Pertinent History  none    Patient Stated Goals  improve ankle ROM and mobility    Currently in Pain?  No/denies    Pain Score  0-No pain    Multiple Pain Sites  No                      [facility] Adult PT Treatment/Exercise - 02/22/17 0809      Ambulation/Gait   Ambulation/Gait  Yes    Ambulation/Gait Assistance  5: Supervision    Ambulation/Gait Assistance Details  Still requiring some cueing to avoid lateral trunk lean; good wt. shift today     Ambulation Distance (Feet)  90  Feet    Assistive device  None      Neuro Re-ed    Neuro Re-ed Details   Tandem walk on blue foam balance beam x 5 laps down/back; side stepping with Alternating LE cone tap x 16 cones; B tandem stance on foam ovals in rhomberg position with diagonal head turns x 30 sec each way       Knee/Hip Exercises: Aerobic   Recumbent Bike  L3 x 6 min      Knee/Hip Exercises: Machines for Strengthening   Cybex Knee Flexion  B con/ R ecc; 10 reps; 35#    Cybex Leg Press  R only 35# x 15 reps       Knee/Hip Exercises: Standing   Heel Raises  Both;15 reps    Heel Raises Limitations  B con/R wt. shift for eccentic  at UBE    SLS  R SLS pallof press with red x 15 reps each way     Other Standing Knee Exercises  Side stepping (with green TB) at ankles, monter walk (with green TB at forefoot)  2 x 50 ft     Other Standing Knee Exercises  BOSU ball squat with 5# dumbbell x 15 reps;  Some cueing required for posterior wt. shift              PT Education - 02/22/17 1329    Education provided  Yes    Education Details  B con/R ecc heel raise     Person(s) Educated  Patient    Methods  Explanation;Demonstration;Verbal cues;Handout    Comprehension  Verbalized understanding;Returned demonstration;Verbal cues required;Need further instruction       PT Short Term Goals - 01/23/17 1416      PT SHORT TERM GOAL #1   Title  patient to be independent with initial HEP    Status  Achieved      PT SHORT TERM GOAL #2   Title  patient to improve R ankle DF PROM 0 degrees (neutral) to allow for improved functional mobility    Status  Achieved        PT Long Term Goals - 02/09/17 0904      PT LONG TERM GOAL #1   Title  patient to be independent with advanced HEP    Status  Partially Met met for current       PT LONG TERM GOAL #2   Title  patient to demonstrate good heel toe gait pattern through WB status as MD allows     Status  On-going Pt. still with uneven stance time B and slight trunk lean        PT LONG TERM GOAL #3   Title  patient to improve R ankle AROM into DF to >/= 5 degrees to allow for improved functional mobility    Status  Achieved      PT LONG TERM GOAL #4   Title  patient to demonstrate stair navigation with single handrail with WB status as MD allows without instability or LOB    Status  Achieved            Plan - 02/22/17 0807    Clinical Impression Statement  Yoshito reports MD concerned with R LE swelling upon recent f/u and sent pt. for testing regarding possible DVT.  Pt. testing positive for R LE DVT and now taking Xeralto.  Jahzion tolerated all balance and strengthening activities well today along with advancement of proprioception training on compliant surfaces.  Connor verbalizing today that he wishes to continue with therapy to further strengthen R LE as he still feels strength deficits with daily activities.  Omarion on track to meet remaining goals.      PT Treatment/Interventions  ADLs/Self Care Home Management;Cryotherapy;Electrical Stimulation;Moist Heat;Ultrasound;Passive range of motion;Vasopneumatic Device;Gait training;Stair training;Functional mobility training;Therapeutic activities;Therapeutic exercise;Patient/family education;Balance training;Neuromuscular re-education;Manual techniques;Scar mobilization;Dry needling;Taping    Consulted and Agree with Plan of Care  Patient       Patient will benefit from skilled therapeutic intervention in order to improve the following deficits and impairments:  Abnormal gait, Decreased activity tolerance, Decreased strength, Decreased balance, Decreased mobility, Difficulty walking, Decreased range of motion, Increased edema  Visit Diagnosis: Stiffness of right ankle, not elsewhere classified  Difficulty in walking, not elsewhere classified  Other abnormalities of gait and mobility  Muscle weakness (generalized)     Problem List There are no active problems to display for this patient.   Bess Harvest, PTA 02/22/17 [facility] [address]  [place], [place], [address] Phone: [phone]   Fax:  [phone]  Name: Mahamud Metts MRN: [id number]  Date of Birth: 1975-12-11

## 2017-02-22 NOTE — Telephone Encounter (Signed)
Please advise 

## 2017-02-23 MED ORDER — RIVAROXABAN 20 MG PO TABS: ORAL_TABLET | ORAL | 0 refills | Status: DC

## 2017-02-23 NOTE — Telephone Encounter (Signed)
Patient aware rx submitted

## 2017-02-23 NOTE — Addendum Note (Signed)
Addended byPrescott Parma: Dolce Sylvia on: 02/23/2017 11:29 AM   Modules accepted: Orders

## 2017-02-26 ENCOUNTER — Encounter: Payer: Self-pay | Admitting: Physical Therapy

## 2017-02-26 ENCOUNTER — Ambulatory Visit: Payer: 59 | Admitting: Physical Therapy

## 2017-02-26 DIAGNOSIS — M25671 Stiffness of right ankle, not elsewhere classified: Secondary | ICD-10-CM | POA: Diagnosis not present

## 2017-02-26 DIAGNOSIS — R262 Difficulty in walking, not elsewhere classified: Secondary | ICD-10-CM

## 2017-02-26 DIAGNOSIS — M6281 Muscle weakness (generalized): Secondary | ICD-10-CM

## 2017-02-26 DIAGNOSIS — R2689 Other abnormalities of gait and mobility: Secondary | ICD-10-CM

## 2017-02-26 NOTE — Therapy (Signed)
[facility] [place] [address]  [address] [place], [place], [address] Phone: [phone]   Fax:  [phone]  Physical Therapy Treatment  Patient Details  Name: Jeremy HongSteve Scheidegger MRN: [id number] Date of Birth: 03/15/1975 Referring Provider: Dr. Annell GreeningMark Yates   Encounter Date: 02/26/2017  PT End of Session - 02/26/17 0800    Visit Number  13    Number of Visits  23    Date for PT Re-Evaluation  04/09/17    PT Start Time  0758    PT Stop Time  0839    PT Time Calculation (min)  41 min    Activity Tolerance  Patient tolerated treatment well    Behavior During Therapy  [facility] for tasks assessed/performed       Past Medical History:  Diagnosis Date  . Achilles tendon rupture 09/2016   right  . Dental crowns present     Past Surgical History:  Procedure Laterality Date  . ACHILLES TENDON SURGERY Right 10/25/2016   Procedure: RIGHT ACHILLES TENDON REPAIR;  Surgeon: Eldred MangesYates, Mark C, MD;  Location: [facility];  Service: Orthopedics;  Laterality: Right;  Achilles Tendon  . NO PAST SURGERIES      There were no vitals filed for this visit.  Subjective Assessment - 02/26/17 0759    Subjective  Doing well - able to go to mountains yesterday and go tubing - very minor swelling after this.     Patient Stated Goals  improve ankle ROM and mobility    Currently in Pain?  No/denies    Pain Score  0-No pain         [facility] PT Assessment - 02/26/17 0001      Assessment   Medical Diagnosis  R Achilles repair    Referring Provider  Dr. Annell GreeningMark Yates    Onset Date/Surgical Date  10/25/16      AROM   Right Ankle Dorsiflexion  15    Right Ankle Plantar Flexion  50                  [facility] Adult PT Treatment/Exercise - 02/26/17 0801      Knee/Hip Exercises: Aerobic   Recumbent Bike  L3 x 6 min      Knee/Hip Exercises: Machines for Strengthening   Cybex Leg Press  B calf raise: 15# x 5; SL calf raise 15# x 15 - good  tolerance vs standing body weight      Knee/Hip Exercises: Standing   Forward Lunges  Right;15 reps    Forward Lunges Limitations  BOSU (up)    Functional Squat  15 reps    Functional Squat Limitations  BOSU (down)    Wall Squat  15 reps orange pball at wall    SLS with Vectors  R SLS on foam with ball tosses/catches     Other Standing Knee Exercises  squat + heel rasie - TRX x 15 reps      Ankle Exercises: Standing   Toe Walk (Round Trip)  B - 50 feet    Balance Beam  5 laps - red tband at forefoot - sidestepping      Ankle Exercises: Stretches   Gastroc Stretch  2 reps;30 seconds    Gastroc Stretch Limitations  prostretch               PT Short Term Goals - 01/23/17 1416      PT SHORT TERM GOAL #1   Title  patient to be independent with initial HEP    Status  Achieved      PT SHORT TERM GOAL #2   Title  patient to improve R ankle DF PROM 0 degrees (neutral) to allow for improved functional mobility    Status  Achieved        PT Long Term Goals - 02/26/17 1217      PT LONG TERM GOAL #1   Title  patient to be independent with advanced HEP    Status  On-going      PT LONG TERM GOAL #2   Title  patient to demonstrate good heel toe gait pattern through WB status as MD allows     Status  Achieved      PT LONG TERM GOAL #3   Title  patient to improve R ankle AROM into DF to >/= 5 degrees to allow for improved functional mobility    Status  Achieved      PT LONG TERM GOAL #4   Title  patient to demonstrate stair navigation with single handrail with WB status as MD allows without instability or LOB    Status  Achieved      PT LONG TERM GOAL #5   Title  patient to demonstrate good jumping/landing mechanics at R foot/ankle with no evidence of instability    Status  New    Target Date  04/09/17            Plan - 02/26/17 0801    Clinical Impression Statement  Oz doing well today - continued use of blood thinner due to DVT noted in 01/2017. Patient  doing much better with gait mechanics and R LE strengthening today. Difficulty remains with SL heel raise and toe walking, however, improved. Much improved weight shift onto R LE with DL stance activities as patient previously tended to refrain from full weight bearing. Excellent progress made with AROM of R ankle. WIll plan to extend POC at this time to allow for conitnued strengtheing of R LE along with progressions towards light plyometric activities as patient tolerates.     PT Treatment/Interventions  ADLs/Self Care Home Management;Cryotherapy;Electrical Stimulation;Moist Heat;Ultrasound;Passive range of motion;Vasopneumatic Device;Gait training;Stair training;Functional mobility training;Therapeutic activities;Therapeutic exercise;Patient/family education;Balance training;Neuromuscular re-education;Manual techniques;Scar mobilization;Dry needling;Taping    Consulted and Agree with Plan of Care  Patient       Patient will benefit from skilled therapeutic intervention in order to improve the following deficits and impairments:  Abnormal gait, Decreased activity tolerance, Decreased strength, Decreased balance, Decreased mobility, Difficulty walking, Decreased range of motion, Increased edema  Visit Diagnosis: Stiffness of right ankle, not elsewhere classified - Plan: PT plan of care cert/re-cert  Difficulty in walking, not elsewhere classified - Plan: PT plan of care cert/re-cert  Other abnormalities of gait and mobility - Plan: PT plan of care cert/re-cert  Muscle weakness (generalized) - Plan: PT plan of care cert/re-cert     Problem List There are no active problems to display for this patient.   Jeremy Nielsen, PT, DPT 02/26/17 12:23 PM   [facility] [address]  [address] [place], [place], [address] Phone: [phone]   Fax:  [phone]  Name: Jeremy Nielsen MRN: [id number] Date of Birth: 01-Oct-1975

## 2017-03-05 ENCOUNTER — Ambulatory Visit: Payer: 59 | Admitting: Physical Therapy

## 2017-03-05 ENCOUNTER — Encounter: Payer: Self-pay | Admitting: Physical Therapy

## 2017-03-05 DIAGNOSIS — R2689 Other abnormalities of gait and mobility: Secondary | ICD-10-CM

## 2017-03-05 DIAGNOSIS — R262 Difficulty in walking, not elsewhere classified: Secondary | ICD-10-CM

## 2017-03-05 DIAGNOSIS — M25671 Stiffness of right ankle, not elsewhere classified: Secondary | ICD-10-CM

## 2017-03-05 DIAGNOSIS — M6281 Muscle weakness (generalized): Secondary | ICD-10-CM

## 2017-03-05 NOTE — Therapy (Signed)
[facility] [place] [address]  [address] [place], [place], [address] Phone: [phone]   Fax:  [phone]  Physical Therapy Treatment  Patient Details  Name: Jeremy HongSteve Nielsen MRN: [id number] Date of Birth: 05/30/1975 Referring Provider: Dr. Annell GreeningMark Yates   Encounter Date: 03/05/2017  PT End of Session - 03/05/17 0757    Visit Number  14    Number of Visits  23    Date for PT Re-Evaluation  04/09/17    PT Start Time  0755    PT Stop Time  0835    PT Time Calculation (min)  40 min    Activity Tolerance  Patient tolerated treatment well    Behavior During Therapy  [facility] for tasks assessed/performed       Past Medical History:  Diagnosis Date  . Achilles tendon rupture 09/2016   right  . Dental crowns present     Past Surgical History:  Procedure Laterality Date  . ACHILLES TENDON SURGERY Right 10/25/2016   Procedure: RIGHT ACHILLES TENDON REPAIR;  Surgeon: Eldred MangesYates, Mark C, MD;  Location: [facility];  Service: Orthopedics;  Laterality: Right;  Achilles Tendon  . NO PAST SURGERIES      There were no vitals filed for this visit.  Subjective Assessment - 03/05/17 0757    Subjective  felt like PT was aggravting knee pain - better today    Patient Stated Goals  improve ankle ROM and mobility    Currently in Pain?  No/denies    Pain Score  0-No pain                      [facility] Adult PT Treatment/Exercise - 03/05/17 0758      Exercises   Exercises  Knee/Hip;Ankle      Knee/Hip Exercises: Aerobic   Tread Mill  "fast" walking - 2.6 mph x 1 min, 2.8 mph x 1 min, 3.0 mph x 1 min    Recumbent Bike  L3 x 6 min      Knee/Hip Exercises: Machines for Strengthening   Cybex Leg Press  B calf raise: 25# x 15 reps; R SL calf raise 20# x 15 reps      Knee/Hip Exercises: Plyometrics   Bilateral Jumping  15 reps;Box Height: 4" heavy VC for landing mechaincs as patient forward wt. shifts    Other Plyometric  Exercises  side to side  - small B hopping x 15 reps      Knee/Hip Exercises: Standing   Forward Lunges  Right;15 reps    Forward Lunges Limitations  BOSU (up)    Other Standing Knee Exercises  squat + heel rasie - TRX x 15 reps    Other Standing Knee Exercises  B side stepping - green tband at forefoot x 25 feet each direction      Ankle Exercises: Seated   Other Seated Ankle Exercises  green tband - inversion, eversion, and DF x 20 reps      Ankle Exercises: Standing   Heel Walk (Round Trip)  1 lap around gym    Toe Walk (Round Trip)  1 lap around gym               PT Short Term Goals - 01/23/17 1416      PT SHORT TERM GOAL #1   Title  patient to be independent with initial HEP    Status  Achieved      PT SHORT  TERM GOAL #2   Title  patient to improve R ankle DF PROM 0 degrees (neutral) to allow for improved functional mobility    Status  Achieved        PT Long Term Goals - 02/26/17 1217      PT LONG TERM GOAL #1   Title  patient to be independent with advanced HEP    Status  On-going      PT LONG TERM GOAL #2   Title  patient to demonstrate good heel toe gait pattern through WB status as MD allows     Status  Achieved      PT LONG TERM GOAL #3   Title  patient to improve R ankle AROM into DF to >/= 5 degrees to allow for improved functional mobility    Status  Achieved      PT LONG TERM GOAL #4   Title  patient to demonstrate stair navigation with single handrail with WB status as MD allows without instability or LOB    Status  Achieved      PT LONG TERM GOAL #5   Title  patient to demonstrate good jumping/landing mechanics at R foot/ankle with no evidence of instability    Status  New    Target Date  04/09/17            Plan - 03/05/17 0757    Clinical Impression Statement  Reports slight onset of knee pain following last session, however has resolved prior to returning to PT. Today able to progress to fast-paced walking as well as low level  plyometrics with B LE with good tolerance. Did require VC and demonstration for landing mechanics as patient has a tendency for forward weight shifting likely placing increased stress on knees. Plan next visit for more quick paced movements and higher height of B jumping per patient tolerance. Will continue to progress towards goals.     PT Treatment/Interventions  ADLs/Self Care Home Management;Cryotherapy;Electrical Stimulation;Moist Heat;Ultrasound;Passive range of motion;Vasopneumatic Device;Gait training;Stair training;Functional mobility training;Therapeutic activities;Therapeutic exercise;Patient/family education;Balance training;Neuromuscular re-education;Manual techniques;Scar mobilization;Dry needling;Taping    Consulted and Agree with Plan of Care  Patient       Patient will benefit from skilled therapeutic intervention in order to improve the following deficits and impairments:  Abnormal gait, Decreased activity tolerance, Decreased strength, Decreased balance, Decreased mobility, Difficulty walking, Decreased range of motion, Increased edema  Visit Diagnosis: Stiffness of right ankle, not elsewhere classified  Difficulty in walking, not elsewhere classified  Other abnormalities of gait and mobility  Muscle weakness (generalized)     Problem List There are no active problems to display for this patient.    Kipp Laurence, PT, DPT 03/05/17 8:44 AM   [facility] [address]  [address] [place], [place], [address] Phone: [phone]   Fax:  [phone]  Name: Jeremy Nielsen MRN: [id number] Date of Birth: 1975-09-24

## 2017-03-12 ENCOUNTER — Ambulatory Visit: Payer: 59

## 2017-03-12 DIAGNOSIS — R2689 Other abnormalities of gait and mobility: Secondary | ICD-10-CM

## 2017-03-12 DIAGNOSIS — R262 Difficulty in walking, not elsewhere classified: Secondary | ICD-10-CM

## 2017-03-12 DIAGNOSIS — M25671 Stiffness of right ankle, not elsewhere classified: Secondary | ICD-10-CM

## 2017-03-12 DIAGNOSIS — M6281 Muscle weakness (generalized): Secondary | ICD-10-CM

## 2017-03-12 NOTE — Therapy (Signed)
[facility] [address]  [address] [place], [place], [address] Phone: [phone]   Fax:  [phone]  Physical Therapy Treatment  Patient Details  Name: Jeremy Nielsen MRN: [id number] Date of Birth: 04/15/1975 Referring Provider: Dr. Annell Greening   Encounter Date: 03/12/2017  PT End of Session - 03/12/17 0800    Visit Number  15    Number of Visits  23    Date for PT Re-Evaluation  04/09/17    PT Start Time  0757    PT Stop Time  0840    PT Time Calculation (min)  43 min    Activity Tolerance  Patient tolerated treatment well    Behavior During Therapy  [facility] for tasks assessed/performed       Past Medical History:  Diagnosis Date  . Achilles tendon rupture 09/2016   right  . Dental crowns present     Past Surgical History:  Procedure Laterality Date  . ACHILLES TENDON SURGERY Right 10/25/2016   Procedure: RIGHT ACHILLES TENDON REPAIR;  Surgeon: Eldred Manges, MD;  Location: [facility];  Service: Orthopedics;  Laterality: Right;  Achilles Tendon  . NO PAST SURGERIES      There were no vitals filed for this visit.  Subjective Assessment - 03/12/17 0759    Subjective  Doing well today with no new complaints.  Admitting to partial HEP adherence.    Patient Stated Goals  improve ankle ROM and mobility    Currently in Pain?  No/denies    Pain Score  0-No pain    Multiple Pain Sites  No                      [facility] Adult PT Treatment/Exercise - 03/12/17 0801      Neuro Re-ed    Neuro Re-ed Details   Alternating cone nock over/righting 2 x 7 cones on blue foam balance beam each LE      Knee/Hip Exercises: Aerobic   Tread Mill  "fast" walking - 2.0 mph x 2 min, 2.5 mph x 2 min, 3.0 mph x 2 min      Knee/Hip Exercises: Plyometrics   Bilateral Jumping  15 reps;Box Height: 6"    Other Plyometric Exercises  side to side, forward/backward  - small B hopping x 15 reps      Knee/Hip  Exercises: Standing   Heel Raises  Both;15 reps    Heel Raises Limitations  B con/R wt. shift for eccentic     Hip Flexion  Left;Right;10 reps;Knee straight;Stengthening    Hip Flexion Limitations  red TB at ankles on foam oval; 1 ski pole     Forward Lunges  Right;15 reps;Left    Forward Lunges Limitations  TRX, onto airex with forward LE     Hip ADduction  Right;Left;10 reps;Strengthening    Hip ADduction Limitations  red TB at ankles on foam oval; 1 ski pole     Hip Abduction  Right;Left;10 reps;Knee straight;Stengthening    Abduction Limitations  red TB at ankles on foam oval; 1 ski pole     Hip Extension  Right;Left;10 reps;Knee straight    Extension Limitations  red TB at ankles on foam oval; 1 ski pole     Wall Squat  15 reps;5 seconds      Ankle Exercises: Stretches   Gastroc Stretch  1 rep;60 seconds    Gastroc Stretch Limitations  prostretch  Ankle Exercises: Standing   Rebounder  Forward/backward tandem stance bouncing x 30 sec each way              PT Education - 03/12/17 0850    Education provided  Yes    Education Details  Toe walking, heel walking     Person(s) Educated  Patient    Methods  Explanation;Verbal cues;Handout    Comprehension  Verbalized understanding;Verbal cues required;Need further instruction       PT Short Term Goals - 01/23/17 1416      PT SHORT TERM GOAL #1   Title  patient to be independent with initial HEP    Status  Achieved      PT SHORT TERM GOAL #2   Title  patient to improve R ankle DF PROM 0 degrees (neutral) to allow for improved functional mobility    Status  Achieved        PT Long Term Goals - 03/12/17 0851      PT LONG TERM GOAL #1   Title  patient to be independent with advanced HEP    Status  On-going      PT LONG TERM GOAL #2   Title  patient to demonstrate good heel toe gait pattern through WB status as MD allows     Status  Achieved      PT LONG TERM GOAL #3   Title  patient to improve R ankle AROM  into DF to >/= 5 degrees to allow for improved functional mobility    Status  Achieved      PT LONG TERM GOAL #4   Title  patient to demonstrate stair navigation with single handrail with WB status as MD allows without instability or LOB    Status  Achieved      PT LONG TERM GOAL #5   Title  patient to demonstrate good jumping/landing mechanics at R foot/ankle with no evidence of instability    Status  On-going Pt. still demonstrating excessive forward wt. shift upon landing with slight L wt. shift             Plan - 03/12/17 0801    Clinical Impression Statement  Jeremy Nielsen doing well today with no new complaints.  Admits to only partial HEP adherence.  HEP updated today to include toe/heel walking, as pt. has been able to perform this without issue in previous treatments.  Jeremy Nielsen able to increase duration of "fast walk" on treadmill, and slight advancement in B jumping height without pain today.  Still with excessive forward wt. shift upon landing today with plyometrics thus frequent cueing provided with some carryover and improved wt. shift.  Progressing well toward goals.  Will be out of town over next week or so thus returning to therapy at beginning of next month.      PT Treatment/Interventions  ADLs/Self Care Home Management;Cryotherapy;Electrical Stimulation;Moist Heat;Ultrasound;Passive range of motion;Vasopneumatic Device;Gait training;Stair training;Functional mobility training;Therapeutic activities;Therapeutic exercise;Patient/family education;Balance training;Neuromuscular re-education;Manual techniques;Scar mobilization;Dry needling;Taping    Consulted and Agree with Plan of Care  Patient       Patient will benefit from skilled therapeutic intervention in order to improve the following deficits and impairments:  Abnormal gait, Decreased activity tolerance, Decreased strength, Decreased balance, Decreased mobility, Difficulty walking, Decreased range of motion, Increased  edema  Visit Diagnosis: Stiffness of right ankle, not elsewhere classified  Difficulty in walking, not elsewhere classified  Other abnormalities of gait and mobility  Muscle weakness (generalized)     Problem  List There are no active problems to display for this patient.   Kermit BaloMicah Sanna Porcaro, PTA 03/12/17 9:04 AM  [facility] [address]  [address] [place], [place], [address] Phone: [phone]   Fax:  [phone]  Name: Jeremy HongSteve Hausner MRN: [id number] Date of Birth: 01/08/1976

## 2017-03-15 ENCOUNTER — Telehealth (INDEPENDENT_AMBULATORY_CARE_PROVIDER_SITE_OTHER): Payer: Self-pay | Admitting: Orthopaedic Surgery

## 2017-03-15 NOTE — Telephone Encounter (Signed)
I called discussed.  

## 2017-03-15 NOTE — Telephone Encounter (Signed)
Please call pt to discuss his meds. Pt also wanted to know if he has travel restrictions due to blood clots.

## 2017-03-15 NOTE — Telephone Encounter (Signed)
[place] It will be a long distance flight (about 10 hours). He would like to know if you have any reservations or concerns about him traveling?  Please advise.

## 2017-03-19 ENCOUNTER — Ambulatory Visit: Payer: 59 | Admitting: Physical Therapy

## 2017-03-23 ENCOUNTER — Ambulatory Visit (INDEPENDENT_AMBULATORY_CARE_PROVIDER_SITE_OTHER): Payer: 59 | Admitting: Orthopaedic Surgery

## 2017-03-26 ENCOUNTER — Ambulatory Visit: Payer: 59 | Admitting: Physical Therapy

## 2017-03-30 ENCOUNTER — Ambulatory Visit: Payer: 59

## 2017-04-06 ENCOUNTER — Other Ambulatory Visit: Payer: Self-pay

## 2017-04-06 ENCOUNTER — Ambulatory Visit: Payer: 59 | Attending: Orthopaedic Surgery | Admitting: Physical Therapy

## 2017-04-06 ENCOUNTER — Encounter: Payer: Self-pay | Admitting: Physical Therapy

## 2017-04-06 DIAGNOSIS — R262 Difficulty in walking, not elsewhere classified: Secondary | ICD-10-CM | POA: Diagnosis present

## 2017-04-06 DIAGNOSIS — M6281 Muscle weakness (generalized): Secondary | ICD-10-CM | POA: Insufficient documentation

## 2017-04-06 DIAGNOSIS — M25671 Stiffness of right ankle, not elsewhere classified: Secondary | ICD-10-CM

## 2017-04-06 DIAGNOSIS — R2689 Other abnormalities of gait and mobility: Secondary | ICD-10-CM

## 2017-04-06 NOTE — Therapy (Signed)
[facility] [address]  [address] [place], [place], [address] Phone: [phone]   Fax:  [phone]  Physical Therapy Treatment  Patient Details  Name: Jeremy Nielsen MRN: [id number] Date of Birth: 09-28-1975 Referring Provider: Dr. Annell Greening   Encounter Date: 04/06/2017  PT End of Session - 04/06/17 0803    Visit Number  16    Number of Visits  23    Date for PT Re-Evaluation  06/01/17    PT Start Time  0802    PT Stop Time  0841    PT Time Calculation (min)  39 min    Activity Tolerance  Patient tolerated treatment well    Behavior During Therapy  [facility] for tasks assessed/performed       Past Medical History:  Diagnosis Date  . Achilles tendon rupture 09/2016   right  . Dental crowns present     Past Surgical History:  Procedure Laterality Date  . ACHILLES TENDON SURGERY Right 10/25/2016   Procedure: RIGHT ACHILLES TENDON REPAIR;  Surgeon: Eldred Manges, MD;  Location: [facility];  Service: Orthopedics;  Laterality: Right;  Achilles Tendon  . NO PAST SURGERIES      There were no vitals filed for this visit.  Subjective Assessment - 04/06/17 0804    Subjective  was in [place] - able to run/fast walk on treadmill while he was gone with no issue.    Patient Stated Goals  improve ankle ROM and mobility    Currently in Pain?  No/denies    Pain Score  0-No pain         [facility] PT Assessment - 04/06/17 0001      Assessment   Medical Diagnosis  R Achilles repair    Referring Provider  Dr. Annell Greening    Onset Date/Surgical Date  10/25/16                  [facility] Adult PT Treatment/Exercise - 04/06/17 0808      Knee/Hip Exercises: Aerobic   Elliptical  L3 x 6 min      Knee/Hip Exercises: Standing   Other Standing Knee Exercises  squat on BOSU (down) x 15      Ankle Exercises: Plyometrics   Plyometric Exercises  ladder: fwd high knees x 4; lateral high knees x 2 each direction;  in/in out/out x 2 each direction; icky shuffle x 4    Plyometric Exercises  lateral SL hopping x 15      Ankle Exercises: Stretches   Gastroc Stretch  3 reps;30 seconds R side only    Gastroc Stretch Limitations  prostretch      Ankle Exercises: Standing   Heel Raises  15 reps B con/R ecc; negative    Other Standing Ankle Exercises  forward lunge on BOSU (up) x 15 each side      Ankle Exercises: Machines for Strengthening   Cybex Leg Press  B calf raise: 2 x 15 - 35#               PT Short Term Goals - 01/23/17 1416      PT SHORT TERM GOAL #1   Title  patient to be independent with initial HEP    Status  Achieved      PT SHORT TERM GOAL #2   Title  patient to improve R ankle DF PROM 0 degrees (neutral) to allow for improved functional mobility    Status  Achieved        PT Long Term Goals - 04/06/17 0846      PT LONG TERM GOAL #1   Title  patient to be independent with advanced HEP    Status  On-going    Target Date  06/01/17      PT LONG TERM GOAL #2   Title  patient to demonstrate good heel toe gait pattern through WB status as MD allows     Status  Achieved      PT LONG TERM GOAL #3   Title  patient to improve R ankle AROM into DF to >/= 5 degrees to allow for improved functional mobility    Status  Achieved      PT LONG TERM GOAL #4   Title  patient to demonstrate stair navigation with single handrail with WB status as MD allows without instability or LOB    Status  Achieved      PT LONG TERM GOAL #5   Title  patient to demonstrate good jumping/landing mechanics at R foot/ankle with no evidence of instability    Status  On-going    Target Date  06/01/17      Additional Long Term Goals   Additional Long Term Goals  Yes      PT LONG TERM GOAL #6   Title  Patient to demonstrate good straight line agility with good foot clearance    Status  New    Target Date  06/01/17      PT LONG TERM GOAL #7   Title  patient to demonstrate good R SL  hopping/jumping with good endurance and form     Status  New    Target Date  06/01/17            Plan - 04/06/17 0807    Clinical Impression Statement  Jeremy Nielsen doing very well. Returning to PT after time spent out of [place] country. Reports while he was gone able to fast walk/slow run on treadmill for 40 min/5 km with no issue. Reports of feeling stable on R foot with no need for compensations due to lack of ROM. PT session today spent on working on more impact activities with good tolerance, however, some strengtha dn endurance limiting patient. Will plan to extend POC for ~8 weeks at this time to continue to improve R LE plyometrics and running form.     PT Treatment/Interventions  ADLs/Self Care Home Management;Cryotherapy;Electrical Stimulation;Moist Heat;Ultrasound;Passive range of motion;Vasopneumatic Device;Gait training;Stair training;Functional mobility training;Therapeutic activities;Therapeutic exercise;Patient/family education;Balance training;Neuromuscular re-education;Manual techniques;Scar mobilization;Dry needling;Taping    Consulted and Agree with Plan of Care  Patient       Patient will benefit from skilled therapeutic intervention in order to improve the following deficits and impairments:  Abnormal gait, Decreased activity tolerance, Decreased strength, Decreased balance, Decreased mobility, Difficulty walking, Decreased range of motion, Increased edema  Visit Diagnosis: Stiffness of right ankle, not elsewhere classified  Difficulty in walking, not elsewhere classified  Other abnormalities of gait and mobility  Muscle weakness (generalized)     Problem List There are no active problems to display for this patient.   Kipp LaurenceStephanie R Aaron, PT, DPT 04/06/17 8:51 AM  [facility] [address]  [address] [place], [place], [address] Phone: [phone]   Fax:  [phone]  Name: Jeremy HongSteve Nielsen MRN:  [id number] Date of Birth: 10/20/1975

## 2017-04-18 ENCOUNTER — Ambulatory Visit: Payer: 59

## 2017-04-18 DIAGNOSIS — M25671 Stiffness of right ankle, not elsewhere classified: Secondary | ICD-10-CM | POA: Diagnosis not present

## 2017-04-18 DIAGNOSIS — R262 Difficulty in walking, not elsewhere classified: Secondary | ICD-10-CM

## 2017-04-18 DIAGNOSIS — M6281 Muscle weakness (generalized): Secondary | ICD-10-CM

## 2017-04-18 DIAGNOSIS — R2689 Other abnormalities of gait and mobility: Secondary | ICD-10-CM

## 2017-04-18 NOTE — Therapy (Addendum)
[facility] [address]  [place], [place], [address] Phone: [phone]   Fax:  [phone]  Physical Therapy Treatment  Patient Details  Name: Jeremy Nielsen MRN: [id number] Date of Birth: Feb 01, 1976 Referring Provider: Dr. Rodell Perna   Encounter Date: 04/18/2017  PT End of Session - 04/18/17 1449    Visit Number  17    Number of Visits  23    Date for PT Re-Evaluation  06/01/17    PT Start Time  2197    PT Stop Time  1523    PT Time Calculation (min)  40 min    Activity Tolerance  Patient tolerated treatment well    Behavior During Therapy  [facility] for tasks assessed/performed       Past Medical History:  Diagnosis Date  . Achilles tendon rupture 09/2016   right  . Dental crowns present     Past Surgical History:  Procedure Laterality Date  . ACHILLES TENDON SURGERY Right 10/25/2016   Procedure: RIGHT ACHILLES TENDON REPAIR;  Surgeon: Marybelle Killings, MD;  Location: [place] [facility];  Service: Orthopedics;  Laterality: Right;  Achilles Tendon  . NO PAST SURGERIES      There were no vitals filed for this visit.  Subjective Assessment - 04/18/17 1445    Subjective  Pt. reporting he's feeling more comfortable with R LE now no longer feeling fatigue >L after long work day.      Pertinent History  none    Patient Stated Goals  improve ankle ROM and mobility    Currently in Pain?  No/denies    Pain Score  0-No pain    Multiple Pain Sites  No                      [facility] Adult PT Treatment/Exercise - 04/18/17 1452      Knee/Hip Exercises: Stretches   Gastroc Stretch  --    Gastroc Stretch Limitations  --    Soleus Stretch  --    Soleus Stretch Limitations  --      Knee/Hip Exercises: Aerobic   Elliptical  L4 x 6 min      Knee/Hip Exercises: Standing   Heel Raises  Both;20 reps    Heel Raises Limitations  B con/R wt. shift for eccentic     Wall Squat  20 reps;5 seconds    Wall  Squat Limitations  leaning on orange p-ball     SLS with Vectors  R SLS on foam oval with cone touch 2 x 45 sec     Other Standing Knee Exercises  Sustained "mini" squat on BOSU bal (down) with ball toss x 2 min       Ankle Exercises: Machines for Strengthening   Cybex Leg Press  B bent knee calf raise: x 20 - 35#; R LE only: 35# x 20 reps       Ankle Exercises: Plyometrics   Plyometric Exercises  Ladder: 1-in-1out x 2 laps, 2 -in-2-out x 2 laps, double leg hop x 2 laps down back     Plyometric Exercises  Forward, lateral SL hopping x 1 lap down/back each at counter     Plyometric Exercises  Side <> side, forward/back small hops x 30 sec each way      Ankle Exercises: Stretches   Soleus Stretch  2 reps;20 seconds after calf raise     Soleus Stretch Limitations  wall  Gastroc Stretch  2 reps;20 seconds after calf raise     Gastroc Stretch Limitations  wall                PT Short Term Goals - 01/23/17 1416      PT SHORT TERM GOAL #1   Title  patient to be independent with initial HEP    Status  Achieved      PT SHORT TERM GOAL #2   Title  patient to improve R ankle DF PROM 0 degrees (neutral) to allow for improved functional mobility    Status  Achieved        PT Long Term Goals - 04/06/17 0846      PT LONG TERM GOAL #1   Title  patient to be independent with advanced HEP    Status  On-going    Target Date  06/01/17      PT LONG TERM GOAL #2   Title  patient to demonstrate good heel toe gait pattern through WB status as MD allows     Status  Achieved      PT LONG TERM GOAL #3   Title  patient to improve R ankle AROM into DF to >/= 5 degrees to allow for improved functional mobility    Status  Achieved      PT LONG TERM GOAL #4   Title  patient to demonstrate stair navigation with single handrail with WB status as MD allows without instability or LOB    Status  Achieved      PT LONG TERM GOAL #5   Title  patient to demonstrate good jumping/landing  mechanics at R foot/ankle with no evidence of instability    Status  On-going    Target Date  06/01/17      Additional Long Term Goals   Additional Long Term Goals  Yes      PT LONG TERM GOAL #6   Title  Patient to demonstrate good straight line agility with good foot clearance    Status  New    Target Date  06/01/17      PT LONG TERM GOAL #7   Title  patient to demonstrate good R SL hopping/jumping with good endurance and form     Status  New    Target Date  06/01/17            Plan - 04/18/17 1449    Clinical Impression Statement  Jeremy Nielsen reports he has been doing well noting improved R LE endurance and less fatigue following long work-day.  Pt. admitting to not performing HEP "much" since last visit.  Jeremy Nielsen tolerated mild advancement in plyometric activities today however still visibly fatigues quickly with single leg hopping activities.  Required cueing today with double leg plyometrics to avoid wt. shift away from R LE with fatigue.  Jeremy Nielsen encouraged to start back with consistent HEP performance for full benefit from therapy.  Pt. plans to contact clinic to schedule more appointments after obtaining work schedule as he may need be out of town.      PT Treatment/Interventions  ADLs/Self Care Home Management;Cryotherapy;Electrical Stimulation;Moist Heat;Ultrasound;Passive range of motion;Vasopneumatic Device;Gait training;Stair training;Functional mobility training;Therapeutic activities;Therapeutic exercise;Patient/family education;Balance training;Neuromuscular re-education;Manual techniques;Scar mobilization;Dry needling;Taping    Consulted and Agree with Plan of Care  Patient       Patient will benefit from skilled therapeutic intervention in order to improve the following deficits and impairments:  Abnormal gait, Decreased activity tolerance, Decreased strength, Decreased balance, Decreased mobility,  Difficulty walking, Decreased range of motion, Increased edema  Visit  Diagnosis: Stiffness of right ankle, not elsewhere classified  Difficulty in walking, not elsewhere classified  Other abnormalities of gait and mobility  Muscle weakness (generalized)     Problem List There are no active problems to display for this patient.   Bess Harvest, PTA 04/18/17 5:33 PM   PHYSICAL THERAPY DISCHARGE SUMMARY  Visits from Start of Care: 17  Current functional level related to goals / functional outcomes: See above - able to progress to plyometrics with limited tolerance/endurance   Remaining deficits: See above   Education / Equipment: HEP  Plan: Patient agrees to discharge.  Patient goals were not met. Patient is being discharged due to not returning since the last visit.  ?????     Jeremy Nielsen, PT, DPT 06/18/17 12:55 PM   [facility] [address]  [place], [place], [address] Phone: [phone]   Fax:  [phone]  Name: Jeremy Nielsen MRN: [id number] Date of Birth: June 01, 1975

## 2018-08-20 IMAGING — US US EXTREM LOW VENOUS*R*
1 series · 13 of 24 positions shown · non-contrast
Comparison: None.

CLINICAL DATA: Prolonged right ankle edema after right Achilles
tendon repair in [REDACTED].



[Series 1: us extrem low venous*right* · 0.07mm/px · 13 of 43 slices shown]
[im 1/43]
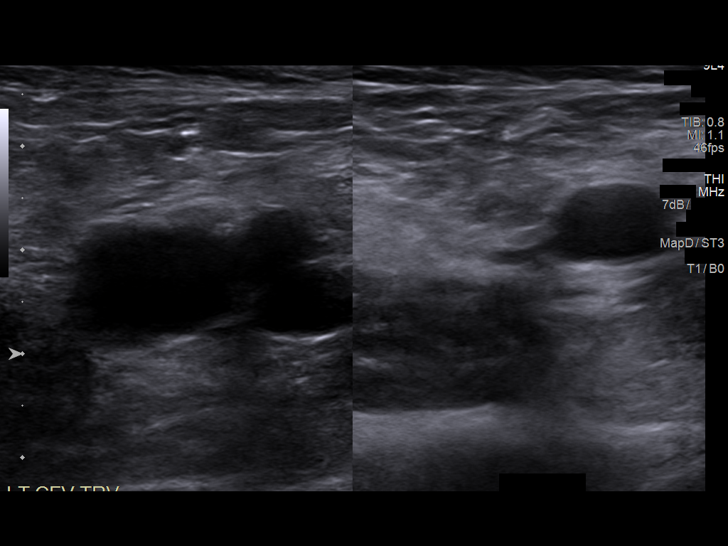
[im 4/43]
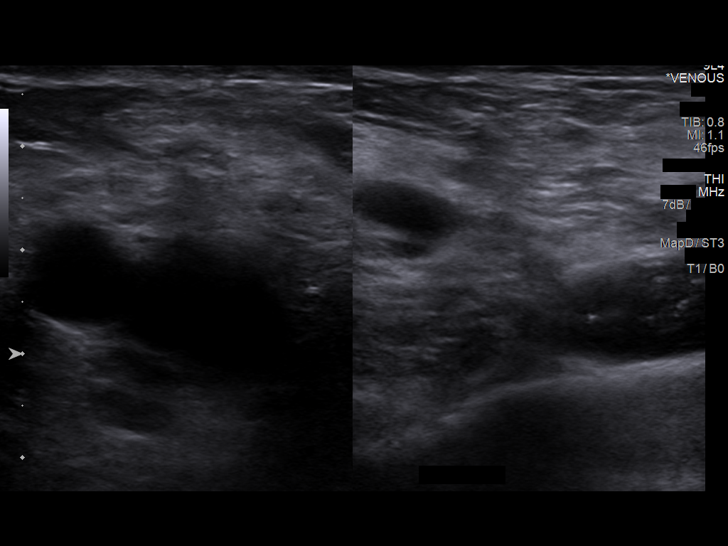
[im 8/43]
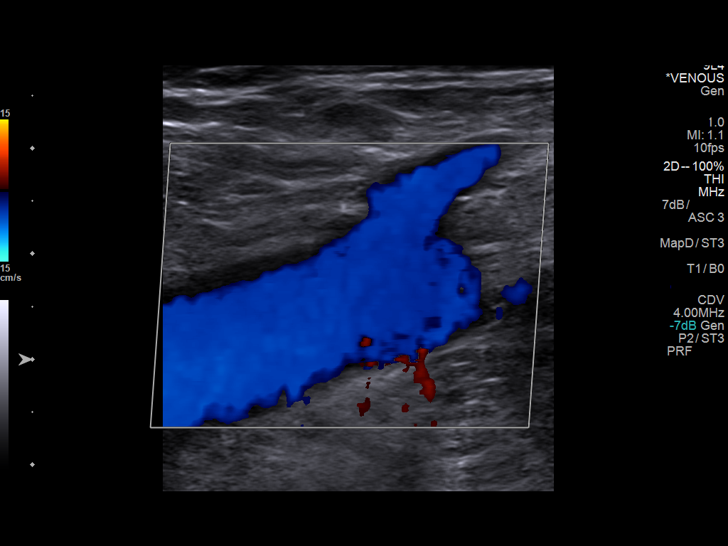
[im 11/43]
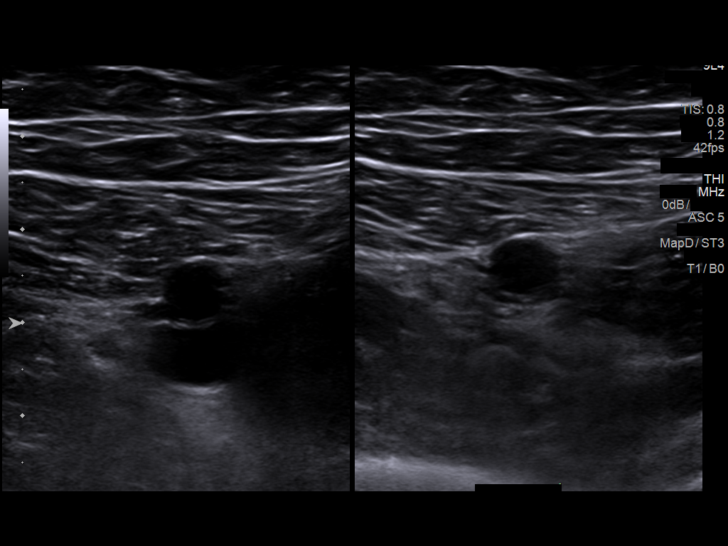
[im 15/43]
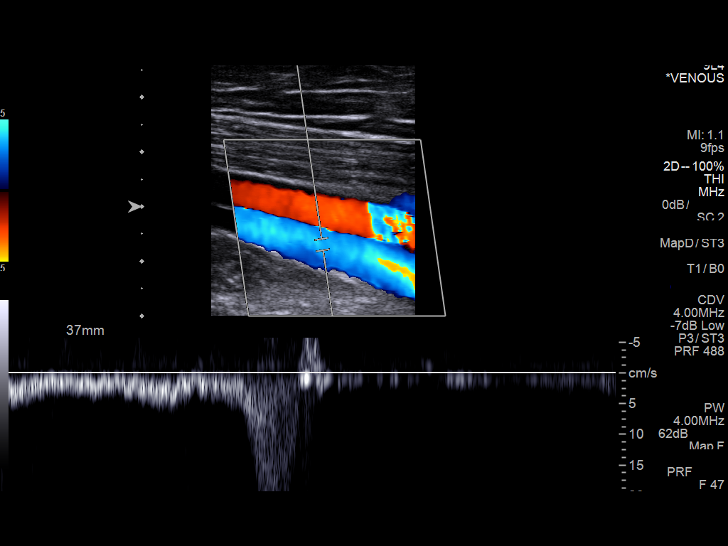
[im 19/43]
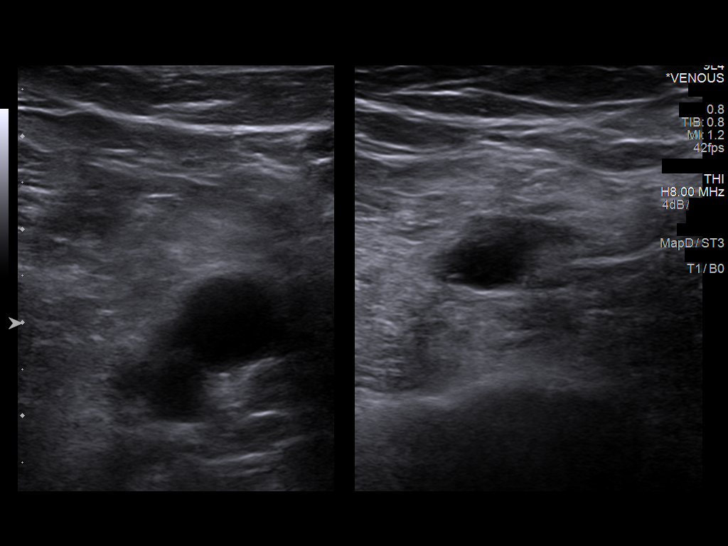
[im 22/43]
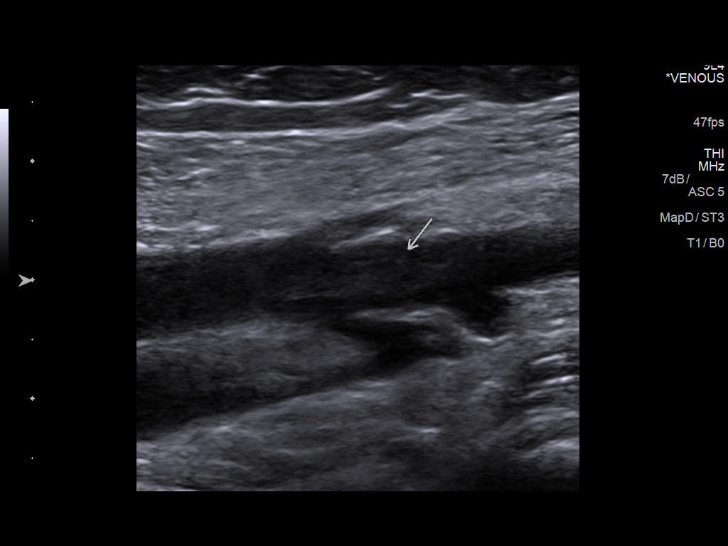
[im 24/43]
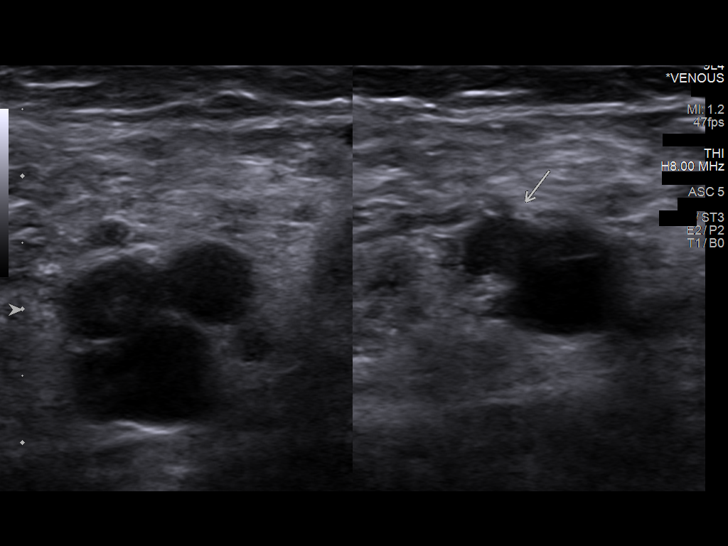
[im 28/43]
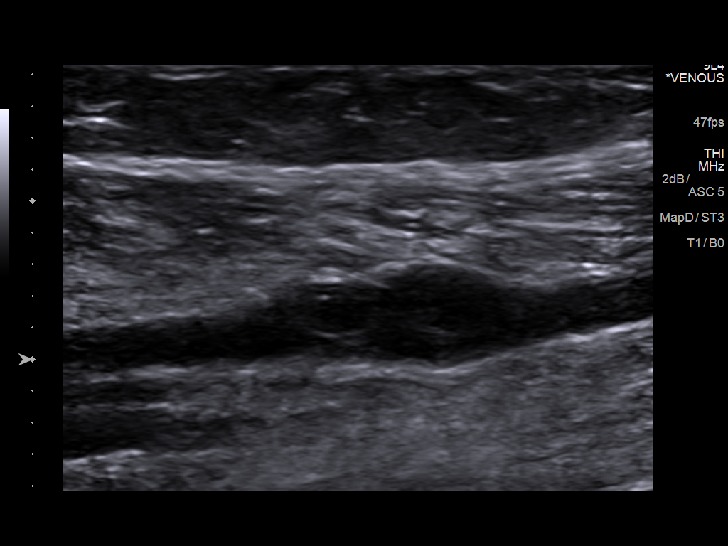
[im 32/43]
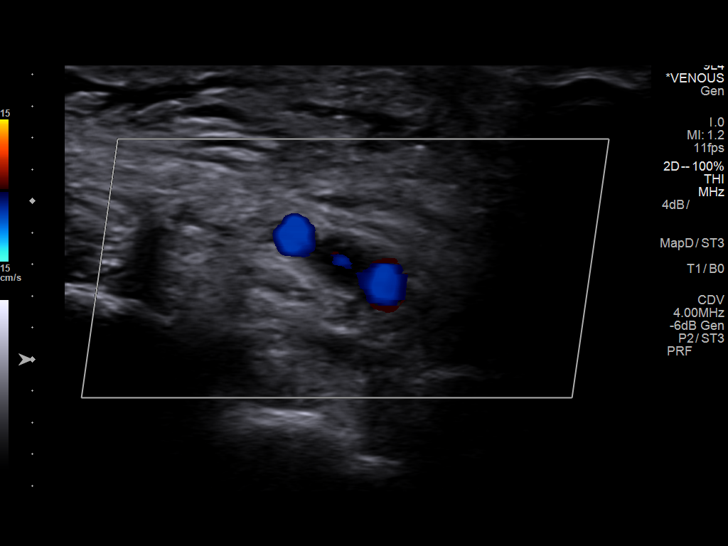
[im 35/43]
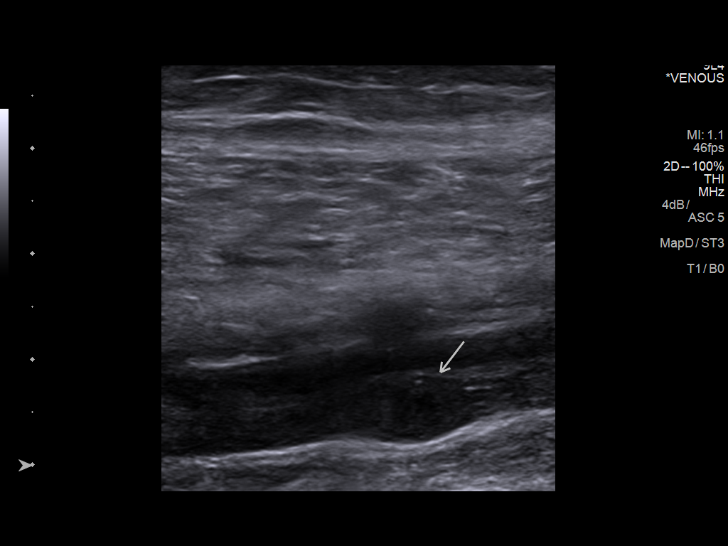
[im 39/43]
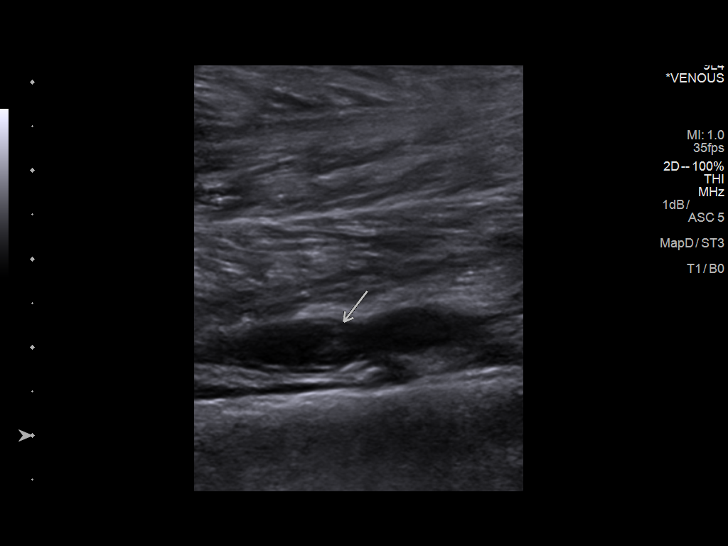
[im 43/43]
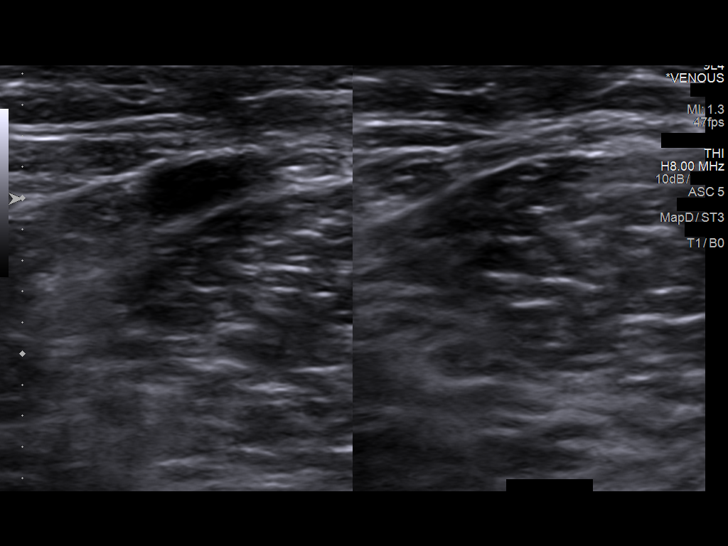

[13 of 24 positions shown; findings below may reference images not displayed]

FINDINGS: Contralateral Common Femoral Vein: Respiratory phasicity is normal
and symmetric with the symptomatic side. No evidence of thrombus.
Normal compressibility.

Common Femoral Vein: No evidence of thrombus. Normal
compressibility, respiratory phasicity and response to augmentation.

Saphenofemoral Junction: No evidence of thrombus. Normal
compressibility and flow on color Doppler imaging.

Profunda Femoral Vein: No evidence of thrombus. Normal
compressibility and flow on color Doppler imaging.

Femoral Vein: No evidence of thrombus. Normal compressibility,
respiratory phasicity and response to augmentation.

Popliteal Vein: Nonocclusive thrombus present.

Calf Veins: The peroneal vein at the level of the mid calf appears
occluded. Posterior tibial veins are occluded at the mid calf but
appear open at the ankle.

Superficial Great Saphenous Vein: No evidence of thrombus. Normal
compressibility.

Venous Reflux:  None.

Other Findings: Nonocclusive thrombus identified in a gastrocnemius
vein in the calf.
IMPRESSION: Deep venous thrombosis identified in the right lower extremity at
the level of the popliteal, peroneal and posterior tibial veins.
There also is nonocclusive thrombus in the gastrocnemius vein.
Thrombus is nonocclusive at the level of the popliteal vein and
occlusive in segments of posterior tibial and peroneal veins.

## 2019-01-15 ENCOUNTER — Other Ambulatory Visit: Payer: Self-pay

## 2019-01-15 DIAGNOSIS — Z20822 Contact with and (suspected) exposure to covid-19: Secondary | ICD-10-CM

## 2019-01-16 LAB — NOVEL CORONAVIRUS, NAA: SARS-CoV-2, NAA: NOT DETECTED

## 2019-01-24 ENCOUNTER — Other Ambulatory Visit: Payer: Self-pay

## 2019-01-24 DIAGNOSIS — Z20822 Contact with and (suspected) exposure to covid-19: Secondary | ICD-10-CM

## 2019-01-26 LAB — NOVEL CORONAVIRUS, NAA: SARS-CoV-2, NAA: NOT DETECTED

## 2019-01-27 ENCOUNTER — Telehealth: Payer: Self-pay | Admitting: *Deleted

## 2019-01-27 NOTE — Telephone Encounter (Signed)
Patient called for results ,given negative covid results . 

## 2022-05-19 ENCOUNTER — Emergency Department (HOSPITAL_COMMUNITY): Payer: BC Managed Care – PPO

## 2022-05-19 ENCOUNTER — Observation Stay (HOSPITAL_COMMUNITY)
Admission: EM | Admit: 2022-05-19 | Discharge: 2022-05-20 | Disposition: A | Discharge status: Home / Self Care | Payer: BC Managed Care – PPO | Attending: Surgery | Admitting: Surgery

## 2022-05-19 ENCOUNTER — Encounter (HOSPITAL_COMMUNITY): Payer: Self-pay

## 2022-05-19 DIAGNOSIS — K3580 Unspecified acute appendicitis: Principal | ICD-10-CM | POA: Insufficient documentation

## 2022-05-19 DIAGNOSIS — K353 Acute appendicitis with localized peritonitis, without perforation or gangrene: Secondary | ICD-10-CM

## 2022-05-19 DIAGNOSIS — R109 Unspecified abdominal pain: Secondary | ICD-10-CM | POA: Diagnosis present

## 2022-05-19 DIAGNOSIS — F17210 Nicotine dependence, cigarettes, uncomplicated: Secondary | ICD-10-CM | POA: Diagnosis not present

## 2022-05-19 LAB — COMPREHENSIVE METABOLIC PANEL
ALT: 27 U/L (ref 0–44)
AST: 18 U/L (ref 15–41)
Albumin: 5.1 g/dL — ABNORMAL HIGH (ref 3.5–5.0)
Alkaline Phosphatase: 39 U/L (ref 38–126)
Anion gap: 12 (ref 5–15)
BUN: 16 mg/dL (ref 6–20)
CO2: 23 mmol/L (ref 22–32)
Calcium: 9.7 mg/dL (ref 8.9–10.3)
Chloride: 102 mmol/L (ref 98–111)
Creatinine, Ser: 0.96 mg/dL (ref 0.61–1.24)
GFR, Estimated: >60 mL/min (ref >60)
Glucose, Bld: 140 mg/dL — ABNORMAL HIGH (ref 70–99)
Potassium: 3.6 mmol/L (ref 3.5–5.1)
Sodium: 137 mmol/L (ref 135–145)
Total Bilirubin: 1.1 mg/dL (ref 0.3–1.2)
Total Protein: 8.4 g/dL — ABNORMAL HIGH (ref 6.5–8.1)

## 2022-05-19 LAB — URINALYSIS, ROUTINE W REFLEX MICROSCOPIC
Bacteria, UA: NONE SEEN
Bilirubin Urine: NEGATIVE
Glucose, UA: NEGATIVE mg/dL
Hgb urine dipstick: NEGATIVE
Ketones, ur: 80 mg/dL — AB
Leukocytes,Ua: NEGATIVE
Nitrite: NEGATIVE
Protein, ur: 30 mg/dL — AB
Specific Gravity, Urine: 1.02 (ref 1.005–1.030)
pH: 8 (ref 5.0–8.0)

## 2022-05-19 LAB — CBC WITH DIFFERENTIAL/PLATELET
Abs Immature Granulocytes: 0.03 10*3/uL (ref 0.00–0.07)
Basophils Absolute: 0 10*3/uL (ref 0.0–0.1)
Basophils Relative: 0 %
Eosinophils Absolute: 0 10*3/uL (ref 0.0–0.5)
Eosinophils Relative: 0 %
HCT: 46.5 % (ref 39.0–52.0)
Hemoglobin: 16 g/dL (ref 13.0–17.0)
Immature Granulocytes: 0 %
Lymphocytes Relative: 10 %
Lymphs Abs: 1.3 10*3/uL (ref 0.7–4.0)
MCH: 30 pg (ref 26.0–34.0)
MCHC: 34.4 g/dL (ref 30.0–36.0)
MCV: 87.1 fL (ref 80.0–100.0)
Monocytes Absolute: 0.7 10*3/uL (ref 0.1–1.0)
Monocytes Relative: 5 %
Neutro Abs: 11.5 10*3/uL — ABNORMAL HIGH (ref 1.7–7.7)
Neutrophils Relative %: 85 %
Platelets: 272 10*3/uL (ref 150–400)
RBC: 5.34 MIL/uL (ref 4.22–5.81)
RDW: 12.8 % (ref 11.5–15.5)
WBC: 13.5 10*3/uL — ABNORMAL HIGH (ref 4.0–10.5)
nRBC: 0 % (ref 0.0–0.2)

## 2022-05-19 LAB — LIPASE, BLOOD: Lipase: 26 U/L (ref 11–51)

## 2022-05-19 MED ORDER — IBUPROFEN 200 MG PO TABS: 600.0000 mg | ORAL_TABLET | Freq: Once | ORAL | Status: DC

## 2022-05-19 MED ORDER — LACTATED RINGERS IV BOLUS
1000.0000 mL | Freq: Once | INTRAVENOUS | Status: AC
  Administered 2022-05-19: 1000 mL via INTRAVENOUS

## 2022-05-19 MED ORDER — ONDANSETRON 4 MG PO TBDP
4.0000 mg | ORAL_TABLET | Freq: Once | ORAL | Status: DC
  Filled 2022-05-19: qty 1

## 2022-05-19 MED ORDER — METOCLOPRAMIDE HCL 5 MG/ML IJ SOLN
10.0000 mg | Freq: Once | INTRAMUSCULAR | Status: AC
  Administered 2022-05-19: 10 mg via INTRAVENOUS
  Filled 2022-05-19: qty 2

## 2022-05-19 MED ORDER — MORPHINE SULFATE (PF) 4 MG/ML IV SOLN
4.0000 mg | Freq: Once | INTRAVENOUS | Status: AC
  Administered 2022-05-19: 4 mg via INTRAVENOUS
  Filled 2022-05-19: qty 1

## 2022-05-19 NOTE — ED Triage Notes (Signed)
Pt arrived POV from UC for abd pain mid/upper, but pain with palpitation to LLQ. N/V, pt received IV nausea medication at UC, but has not seem to help. Pt sent over for evaluation of diverticulitis or possible kidney stone.

## 2022-05-19 NOTE — ED Notes (Signed)
Dr. Goldston at bedside.  

## 2022-05-19 NOTE — ED Provider Notes (Signed)
[place] EMERGENCY DEPARTMENT AT [facility] Provider Note   CSN: [id number] Arrival date & time: 05/19/22  2016     History  Chief Complaint  Patient presents with   Abdominal Pain    Jeremy Nielsen is a 47 y.o. male.  HPI 47 year old male presents with abdominal pain, vomiting, and diarrhea. This all started around 3 pm today. Mostly has had vomiting and diffuse abdominal cramping. He has had about 3 episodes of non-bloody diarrhea. He went to urgent care and due to left lower quadrant pain was sent here.  He was given Zofran which did not help.  Right now his biggest complaint is his pain which he states is severe. No significant past medical history.  Home Medications Prior to Admission medications   Medication Sig Start Date End Date Taking? Authorizing Provider  Rivaroxaban (XARELTO STARTER PACK) 15 & 20 MG TBPK  02/09/17   [provider]  rivaroxaban (XARELTO) 20 MG TABS tablet 1 po daily 02/23/17   Marybelle Killings, MD  Rivaroxaban 15 & 20 MG TBPK Take as directed on package: Start with one 15mg  tablet by mouth twice a day with food. On Day 22, switch to one 20mg  tablet once a day with food. 02/09/17   Gareth Morgan, MD      Allergies    Patient has no known allergies.    Review of Systems   Review of Systems  Constitutional:  Negative for fever.  Gastrointestinal:  Positive for abdominal pain, diarrhea and vomiting. Negative for blood in stool.    Physical Exam Updated Vital Signs BP 131/76   Pulse 73   Temp 98.5 F (36.9 C) (Oral)   Resp 18   SpO2 94%  Physical Exam Vitals and nursing note reviewed.  Constitutional:      General: He is not in acute distress.    Appearance: He is well-developed. He is not ill-appearing or diaphoretic.  HENT:     Head: Normocephalic and atraumatic.  Cardiovascular:     Rate and Rhythm: Normal rate and regular rhythm.     Heart sounds: Normal heart sounds.  Pulmonary:     Effort: Pulmonary effort  is normal.     Breath sounds: Normal breath sounds.  Abdominal:     Palpations: Abdomen is soft.     Tenderness: There is generalized abdominal tenderness.  Skin:    General: Skin is warm and dry.  Neurological:     Mental Status: He is alert.     ED Results / Procedures / Treatments   Labs (all labs ordered are listed, but only abnormal results are displayed) Labs Reviewed  COMPREHENSIVE METABOLIC PANEL - Abnormal; Notable for the following components:      Result Value   Glucose, Bld 140 (*)    Total Protein 8.4 (*)    Albumin 5.1 (*)    All other components within normal limits  CBC WITH DIFFERENTIAL/PLATELET - Abnormal; Notable for the following components:   WBC 13.5 (*)    Neutro Abs 11.5 (*)    All other components within normal limits  URINALYSIS, ROUTINE W REFLEX MICROSCOPIC - Abnormal; Notable for the following components:   APPearance CLOUDY (*)    Ketones, ur 80 (*)    Protein, ur 30 (*)    All other components within normal limits  LIPASE, BLOOD    EKG None  Radiology No results found.  Procedures Procedures    Medications Ordered in ED Medications  ondansetron (ZOFRAN-ODT) disintegrating  tablet 4 mg (4 mg Oral Not Given 05/19/22 2102)  lactated ringers bolus 1,000 mL (1,000 mLs Intravenous New Bag/Given 05/19/22 2221)  morphine (PF) 4 MG/ML injection 4 mg (4 mg Intravenous Given 05/19/22 2221)  metoCLOPramide (REGLAN) injection 10 mg (10 mg Intravenous Given 05/19/22 2221)    ED Course/ Medical Decision Making/ A&P                             Medical Decision Making Amount and/or Complexity of Data Reviewed Labs: ordered. Radiology: ordered.  Risk Prescription drug management.   Labs show leukocytosis though this could be reactive to vomiting.  No AKI.  At this point he has some ketones in his urine which goes along with the dehydration/vomiting.  CT has been ordered though not yet started given the degree of discomfort and pain.  Care  transferred to Dr. Florina Ou.        Final Clinical Impression(s) / ED Diagnoses Final diagnoses:  None    Rx / DC Orders ED Discharge Orders     None         Sherwood Gambler, MD 05/19/22 [phone]

## 2022-05-20 ENCOUNTER — Observation Stay (HOSPITAL_COMMUNITY): Payer: BC Managed Care – PPO | Admitting: Certified Registered Nurse Anesthetist

## 2022-05-20 ENCOUNTER — Encounter (HOSPITAL_COMMUNITY)
Admission: EM | Disposition: A | Discharge status: Home / Self Care | Payer: Self-pay | Source: Home / Self Care | Attending: Emergency Medicine

## 2022-05-20 DIAGNOSIS — K3580 Unspecified acute appendicitis: Secondary | ICD-10-CM | POA: Diagnosis present

## 2022-05-20 HISTORY — PX: LAPAROSCOPIC APPENDECTOMY: SHX408

## 2022-05-20 SURGERY — APPENDECTOMY, LAPAROSCOPIC
Anesthesia: General | Site: Abdomen

## 2022-05-20 MED ORDER — LACTATED RINGERS IV SOLN: INTRAVENOUS | Status: DC | PRN

## 2022-05-20 MED ORDER — SODIUM CHLORIDE 0.9 % IV SOLN
2.0000 g | Freq: Once | INTRAVENOUS | Status: AC
  Administered 2022-05-20: 2 g via INTRAVENOUS
  Filled 2022-05-20: qty 20

## 2022-05-20 MED ORDER — TRAMADOL HCL 50 MG PO TABS: 50.0000 mg | ORAL_TABLET | Freq: Four times a day (QID) | ORAL | Status: DC | PRN

## 2022-05-20 MED ORDER — FENTANYL CITRATE (PF) 100 MCG/2ML IJ SOLN
INTRAMUSCULAR | Status: DC | PRN
  Administered 2022-05-20: 50 ug via INTRAVENOUS
  Administered 2022-05-20 (×2): 100 ug via INTRAVENOUS

## 2022-05-20 MED ORDER — ONDANSETRON HCL 4 MG/2ML IJ SOLN
INTRAMUSCULAR | Status: AC
  Filled 2022-05-20: qty 2

## 2022-05-20 MED ORDER — 0.9 % SODIUM CHLORIDE (POUR BTL) OPTIME
TOPICAL | Status: DC | PRN
  Administered 2022-05-20: 1000 mL

## 2022-05-20 MED ORDER — METHOCARBAMOL 500 MG IVPB - SIMPLE MED: 500.0000 mg | Freq: Four times a day (QID) | INTRAVENOUS | Status: DC | PRN

## 2022-05-20 MED ORDER — BUPIVACAINE HCL (PF) 0.5 % IJ SOLN
INTRAMUSCULAR | Status: AC
  Filled 2022-05-20: qty 30

## 2022-05-20 MED ORDER — LIDOCAINE 2% (20 MG/ML) 5 ML SYRINGE
INTRAMUSCULAR | Status: DC | PRN
  Administered 2022-05-20: 100 mg via INTRAVENOUS

## 2022-05-20 MED ORDER — LACTATED RINGERS IR SOLN
Status: DC | PRN
  Administered 2022-05-20: 1000 mL

## 2022-05-20 MED ORDER — METRONIDAZOLE 500 MG/100ML IV SOLN: 500.0000 mg | Freq: Two times a day (BID) | INTRAVENOUS | Status: DC

## 2022-05-20 MED ORDER — DEXAMETHASONE SODIUM PHOSPHATE 4 MG/ML IJ SOLN
INTRAMUSCULAR | Status: DC | PRN
  Administered 2022-05-20: 5 mg via INTRAVENOUS

## 2022-05-20 MED ORDER — TRAMADOL HCL 50 MG PO TABS: 50.0000 mg | ORAL_TABLET | Freq: Four times a day (QID) | ORAL | 0 refills | Status: DC | PRN

## 2022-05-20 MED ORDER — SUGAMMADEX SODIUM 200 MG/2ML IV SOLN
INTRAVENOUS | Status: DC | PRN
  Administered 2022-05-20: 200 mg via INTRAVENOUS

## 2022-05-20 MED ORDER — ROCURONIUM BROMIDE 10 MG/ML (PF) SYRINGE
PREFILLED_SYRINGE | INTRAVENOUS | Status: AC
  Filled 2022-05-20: qty 10

## 2022-05-20 MED ORDER — KETOROLAC TROMETHAMINE 30 MG/ML IJ SOLN: 30.0000 mg | Freq: Once | INTRAMUSCULAR | Status: DC | PRN

## 2022-05-20 MED ORDER — ONDANSETRON HCL 4 MG/2ML IJ SOLN
4.0000 mg | Freq: Four times a day (QID) | INTRAMUSCULAR | Status: DC | PRN
  Administered 2022-05-20 (×2): 4 mg via INTRAVENOUS
  Filled 2022-05-20: qty 2

## 2022-05-20 MED ORDER — DOCUSATE SODIUM 100 MG PO CAPS
100.0000 mg | ORAL_CAPSULE | Freq: Two times a day (BID) | ORAL | Status: DC
  Filled 2022-05-20: qty 1

## 2022-05-20 MED ORDER — FENTANYL CITRATE (PF) 250 MCG/5ML IJ SOLN
INTRAMUSCULAR | Status: AC
  Filled 2022-05-20: qty 5

## 2022-05-20 MED ORDER — MIDAZOLAM HCL 5 MG/5ML IJ SOLN
INTRAMUSCULAR | Status: DC | PRN
  Administered 2022-05-20: 2 mg via INTRAVENOUS

## 2022-05-20 MED ORDER — ACETAMINOPHEN 160 MG/5ML PO SOLN: 325.0000 mg | ORAL | Status: DC | PRN

## 2022-05-20 MED ORDER — TRAMADOL HCL 50 MG PO TABS: 50.0000 mg | ORAL_TABLET | Freq: Four times a day (QID) | ORAL | 0 refills | Status: AC | PRN

## 2022-05-20 MED ORDER — ONDANSETRON 4 MG PO TBDP: 4.0000 mg | ORAL_TABLET | Freq: Four times a day (QID) | ORAL | Status: DC | PRN

## 2022-05-20 MED ORDER — DEXAMETHASONE SODIUM PHOSPHATE 10 MG/ML IJ SOLN
INTRAMUSCULAR | Status: AC
  Filled 2022-05-20: qty 1

## 2022-05-20 MED ORDER — HYDROMORPHONE HCL 1 MG/ML IJ SOLN: 0.5000 mg | INTRAMUSCULAR | Status: DC | PRN

## 2022-05-20 MED ORDER — METRONIDAZOLE 500 MG/100ML IV SOLN
500.0000 mg | Freq: Once | INTRAVENOUS | Status: AC
  Administered 2022-05-20: 500 mg via INTRAVENOUS
  Filled 2022-05-20: qty 100

## 2022-05-20 MED ORDER — ACETAMINOPHEN 500 MG PO TABS
1000.0000 mg | ORAL_TABLET | Freq: Four times a day (QID) | ORAL | Status: DC
  Administered 2022-05-20: 1000 mg via ORAL
  Filled 2022-05-20: qty 2

## 2022-05-20 MED ORDER — PROPOFOL 10 MG/ML IV BOLUS
INTRAVENOUS | Status: DC | PRN
  Administered 2022-05-20: 200 mg via INTRAVENOUS

## 2022-05-20 MED ORDER — ONDANSETRON HCL 4 MG/2ML IJ SOLN: 4.0000 mg | Freq: Once | INTRAMUSCULAR | Status: DC | PRN

## 2022-05-20 MED ORDER — DEXTROSE-NACL 5-0.9 % IV SOLN: INTRAVENOUS | Status: DC

## 2022-05-20 MED ORDER — OXYCODONE HCL 5 MG PO TABS: 5.0000 mg | ORAL_TABLET | Freq: Once | ORAL | Status: DC | PRN

## 2022-05-20 MED ORDER — MIDAZOLAM HCL 2 MG/2ML IJ SOLN
INTRAMUSCULAR | Status: AC
  Filled 2022-05-20: qty 2

## 2022-05-20 MED ORDER — DOCUSATE SODIUM 100 MG PO CAPS: 100.0000 mg | ORAL_CAPSULE | Freq: Two times a day (BID) | ORAL | 0 refills | Status: AC

## 2022-05-20 MED ORDER — KETOROLAC TROMETHAMINE 30 MG/ML IJ SOLN
30.0000 mg | Freq: Four times a day (QID) | INTRAMUSCULAR | Status: DC | PRN
  Administered 2022-05-20: 30 mg via INTRAVENOUS
  Filled 2022-05-20: qty 1

## 2022-05-20 MED ORDER — FENTANYL CITRATE PF 50 MCG/ML IJ SOSY: 25.0000 ug | PREFILLED_SYRINGE | INTRAMUSCULAR | Status: DC | PRN

## 2022-05-20 MED ORDER — MEPERIDINE HCL 50 MG/ML IJ SOLN: 6.2500 mg | INTRAMUSCULAR | Status: DC | PRN

## 2022-05-20 MED ORDER — BUPIVACAINE HCL (PF) 0.5 % IJ SOLN
INTRAMUSCULAR | Status: DC | PRN
  Administered 2022-05-20: 25 mL

## 2022-05-20 MED ORDER — ACETAMINOPHEN 325 MG PO TABS: 325.0000 mg | ORAL_TABLET | ORAL | Status: DC | PRN

## 2022-05-20 MED ORDER — ROCURONIUM BROMIDE 10 MG/ML (PF) SYRINGE
PREFILLED_SYRINGE | INTRAVENOUS | Status: DC | PRN
  Administered 2022-05-20: 50 mg via INTRAVENOUS

## 2022-05-20 MED ORDER — DOCUSATE SODIUM 100 MG PO CAPS: 100.0000 mg | ORAL_CAPSULE | Freq: Two times a day (BID) | ORAL | 0 refills | Status: DC

## 2022-05-20 MED ORDER — OXYCODONE HCL 5 MG/5ML PO SOLN: 5.0000 mg | Freq: Once | ORAL | Status: DC | PRN

## 2022-05-20 MED ORDER — IOHEXOL 300 MG/ML  SOLN
100.0000 mL | Freq: Once | INTRAMUSCULAR | Status: AC | PRN
  Administered 2022-05-20: 100 mL via INTRAVENOUS

## 2022-05-20 SURGICAL SUPPLY — 49 items
APPLIER CLIP 5 13 M/L LIGAMAX5 (MISCELLANEOUS)
APPLIER CLIP ROT 10 11.4 M/L (STAPLE)
BAG COUNTER SPONGE SURGICOUNT (BAG) IMPLANT
CABLE HIGH FREQUENCY MONO STRZ (ELECTRODE) IMPLANT
CHLORAPREP W/TINT 26 (MISCELLANEOUS) ×1 IMPLANT
CLIP APPLIE 5 13 M/L LIGAMAX5 (MISCELLANEOUS) IMPLANT
CLIP APPLIE ROT 10 11.4 M/L (STAPLE) IMPLANT
COVER SURGICAL LIGHT HANDLE (MISCELLANEOUS) ×1 IMPLANT
CUTTER FLEX LINEAR 45M (STAPLE) IMPLANT
DERMABOND ADVANCED .7 DNX12 (GAUZE/BANDAGES/DRESSINGS) ×1 IMPLANT
DRAIN CHANNEL 19F RND (DRAIN) IMPLANT
ELECT REM PT RETURN 15FT ADLT (MISCELLANEOUS) ×1 IMPLANT
ENDOLOOP SUT PDS II  0 18 (SUTURE)
ENDOLOOP SUT PDS II 0 18 (SUTURE) IMPLANT
EVACUATOR SILICONE 100CC (DRAIN) IMPLANT
GLOVE BIO SURGEON STRL SZ 6 (GLOVE) ×1 IMPLANT
GLOVE INDICATOR 6.5 STRL GRN (GLOVE) ×1 IMPLANT
GLOVE SS BIOGEL STRL SZ 6 (GLOVE) ×1 IMPLANT
GOWN STRL REUS W/ TWL LRG LVL3 (GOWN DISPOSABLE) ×1 IMPLANT
GOWN STRL REUS W/ TWL XL LVL3 (GOWN DISPOSABLE) IMPLANT
GOWN STRL REUS W/TWL LRG LVL3 (GOWN DISPOSABLE) ×1
GOWN STRL REUS W/TWL XL LVL3 (GOWN DISPOSABLE)
GRASPER SUT TROCAR 14GX15 (MISCELLANEOUS) IMPLANT
IRRIG SUCT STRYKERFLOW 2 WTIP (MISCELLANEOUS) ×1
IRRIGATION SUCT STRKRFLW 2 WTP (MISCELLANEOUS) ×1 IMPLANT
KIT BASIN OR (CUSTOM PROCEDURE TRAY) ×1 IMPLANT
KIT TURNOVER KIT A (KITS) IMPLANT
NDL INSUFFLATION 14GA 120MM (NEEDLE) ×1 IMPLANT
NEEDLE INSUFFLATION 14GA 120MM (NEEDLE) ×1 IMPLANT
RELOAD 45 VASCULAR/THIN (ENDOMECHANICALS) ×1 IMPLANT
RELOAD STAPLE 45 2.5 WHT GRN (ENDOMECHANICALS) IMPLANT
RELOAD STAPLE 45 3.5 BLU ETS (ENDOMECHANICALS) IMPLANT
RELOAD STAPLE TA45 3.5 REG BLU (ENDOMECHANICALS) IMPLANT
SCISSORS LAP 5X35 DISP (ENDOMECHANICALS) IMPLANT
SET TUBE SMOKE EVAC HIGH FLOW (TUBING) ×1 IMPLANT
SHEARS HARMONIC ACE PLUS 36CM (ENDOMECHANICALS) ×1 IMPLANT
SLEEVE Z-THREAD 5X100MM (TROCAR) ×1 IMPLANT
SPIKE FLUID TRANSFER (MISCELLANEOUS) ×1 IMPLANT
SUT ETHILON 2 0 PS N (SUTURE) IMPLANT
SUT MNCRL AB 4-0 PS2 18 (SUTURE) ×1 IMPLANT
SYS BAG RETRIEVAL 10MM (BASKET) ×1
SYSTEM BAG RETRIEVAL 10MM (BASKET) ×1 IMPLANT
TOWEL OR 17X26 10 PK STRL BLUE (TOWEL DISPOSABLE) ×1 IMPLANT
TOWEL OR NON WOVEN STRL DISP B (DISPOSABLE) ×1 IMPLANT
TRAY FOLEY MTR SLVR 14FR STAT (SET/KITS/TRAYS/PACK) IMPLANT
TRAY FOLEY MTR SLVR 16FR STAT (SET/KITS/TRAYS/PACK) IMPLANT
TRAY LAPAROSCOPIC (CUSTOM PROCEDURE TRAY) ×1 IMPLANT
TROCAR ADV FIXATION 12X100MM (TROCAR) ×1 IMPLANT
TROCAR Z-THREAD OPTICAL 5X100M (TROCAR) ×1 IMPLANT

## 2022-05-20 NOTE — Anesthesia Preprocedure Evaluation (Addendum)
Anesthesia Evaluation  Patient identified by MRN, date of birth, ID band Patient awake    Reviewed: Allergy & Precautions, NPO status , Patient's Chart, lab work & pertinent test results  Airway Mallampati: II       Dental no notable dental hx.    Pulmonary Current Smoker   Pulmonary exam normal        Cardiovascular negative cardio ROS Normal cardiovascular exam     Neuro/Psych negative neurological ROS  negative psych ROS   GI/Hepatic Neg liver ROS,,,  Endo/Other  negative endocrine ROS    Renal/GU negative Renal ROS  negative genitourinary   Musculoskeletal negative musculoskeletal ROS (+)    Abdominal  (+) + obese  Peds  Hematology negative hematology ROS (+)   Anesthesia Other Findings   Reproductive/Obstetrics                             Anesthesia Physical Anesthesia Plan  ASA: 2  Anesthesia Plan: General   Post-op Pain Management: Dilaudid IV   Induction: Intravenous  PONV Risk Score and Plan: 3 and Ondansetron, Midazolam and Dexamethasone  Airway Management Planned: Oral ETT  Additional Equipment: None  Intra-op Plan:   Post-operative Plan: Extubation in OR  Informed Consent: I have reviewed the patients History and Physical, chart, labs and discussed the procedure including the risks, benefits and alternatives for the proposed anesthesia with the patient or authorized representative who has indicated his/her understanding and acceptance.     Dental advisory given  Plan Discussed with: CRNA  Anesthesia Plan Comments:        Anesthesia Quick Evaluation

## 2022-05-20 NOTE — Progress Notes (Signed)
  Transition of Care [facility]) Screening Note   Patient Details  Name: Jeremy Nielsen Date of Birth: [birth year]   Transition of Care [facility]) CM/SW Contact:    Henrietta Dine, RN Phone Number: 05/20/2022, 5:27 PM    Transition of Care Department [facility]) has reviewed patient and no TOC needs have been identified at this time. We will continue to monitor patient advancement through interdisciplinary progression rounds. If new patient transition needs arise, please place a TOC consult.

## 2022-05-20 NOTE — Op Note (Signed)
Operative Report  Aydeen Strathman 47 y.o. male  [id number]  [id number]  05/20/2022  Surgeon: Romana Juniper MD FACS   Procedure performed: Laparoscopic Appendectomy   Preop diagnosis: Acute appendicitis   Post-op diagnosis/intraop findings: Acute appendicitis with small volume of pus in the pelvis   Specimens: appendix   EBL: minimal   Complications: none   Description of procedure: After obtaining informed consent the patient was brought to the operating room. Antibiotics were administered. SCD's were applied. General endotracheal anesthesia was initiated and a formal time-out was performed. Foley catheter was inserted which is removed at the end of the case. The abdomen was prepped and draped in the usual sterile fashion and the abdomen was entered using an infraumbilical Veress needle and insufflated to 15 mmHg. A 5 mm trocar and camera were then introduced, the abdomen was inspected and there is no evidence of injury from our entry. A suprapubic 5 mm trocar and a left lower quadrant 12 mm trocar were introduced under direct visualization following infiltration with local. The patient was then placed in Trendelenburg and rotated to the left and the small bowel was reflected cephalad. The appendix is found to be acutely inflamed, without evidence of perforation. The appendix was retrocecal. A combination of blunt dissection and harmonic scalpel were used to free it of its retroperitoneal attachments and the cecum was mobilized slightly to fully expose the base of the appendix. Great care was taken to ensure no injury to surrounding retroperitoneal structures, cecum or terminal ileum. The mesoappendix was divided with the harmonic scalpel, isolating the base of the appendix. Hemostasis was ensured. A white load 82mm endoGIA stapler was used to transect the appendix from the cecum, taking a cuff of viable cecum with the specimen. The appendix was placed in an Endo Catch bag and removed through  our 12 mm trocar site. The staple line was reinspected and confirmed to be intact, hemostatic, and viable. The small bowel was run several feet from the ilececal valve proximally with no other abnormalities identified.  Small volume of pus in the pelvis was evacuated and sparingly irrigated.  The omentum was brought down over the staple line.  The 50mm trocar site in the left lower quadrant was closed with a 0 vicryl in the fascia under direct visualization using a PMI device. The abdomen was desufflated and all trocars removed. The skin incisions were closed with subcuticular 4-0 monocryl and Dermabond. The patient was awakened, extubated and transported to the recovery room in stable condition.    All counts were correct at the completion of the case.

## 2022-05-20 NOTE — H&P (Signed)
Jeremy Nielsen is an 47 y.o. male.   Chief Complaint: abd pain HPI: Pt is a 59 M with pain starting yesteday at 3pm.  Pt states pain cont and felt like bloating.  He states the pain was generalized.  He states he had chills and n/v at home. He was taken to urgent care and was referred to the ED.  At ED he underwent CT which showed acute appendicitis with fecalith.  Pt was started on Abx.  Pt with elev WBC  I did review the labs and CT scan personally.   Past Medical History:  Diagnosis Date   Achilles tendon rupture 09/2016   right   Dental crowns present     Past Surgical History:  Procedure Laterality Date   ACHILLES TENDON SURGERY Right 10/25/2016   Procedure: RIGHT ACHILLES TENDON REPAIR;  Surgeon: Marybelle Killings, MD;  Location: [place];  Service: Orthopedics;  Laterality: Right;  Achilles Tendon   NO PAST SURGERIES      History reviewed. No pertinent family history. Social History:  reports that he has been smoking cigarettes. He has never used smokeless tobacco. He reports current alcohol use. He reports that he does not use drugs.  Allergies: No Known Allergies  Medications Prior to Admission  Medication Sig Dispense Refill   Rivaroxaban (XARELTO STARTER PACK) 15 & 20 MG TBPK      rivaroxaban (XARELTO) 20 MG TABS tablet 1 po daily 60 tablet 0   Rivaroxaban 15 & 20 MG TBPK Take as directed on package: Start with one 15mg  tablet by mouth twice a day with food. On Day 22, switch to one 20mg  tablet once a day with food. 51 each 0    Results for orders placed or performed during the hospital encounter of 05/19/22 (from the past 48 hour(s))  Urinalysis, Routine w reflex microscopic -Urine, Clean Catch     Status: Abnormal   Collection Time: 05/19/22  8:54 PM  Result Value Ref Range   Color, Urine YELLOW YELLOW   APPearance CLOUDY (A) CLEAR   Specific Gravity, Urine 1.020 1.005 - 1.030   pH 8.0 5.0 - 8.0   Glucose, UA NEGATIVE NEGATIVE mg/dL   Hgb urine  dipstick NEGATIVE NEGATIVE   Bilirubin Urine NEGATIVE NEGATIVE   Ketones, ur 80 (A) NEGATIVE mg/dL   Protein, ur 30 (A) NEGATIVE mg/dL   Nitrite NEGATIVE NEGATIVE   Leukocytes,Ua NEGATIVE NEGATIVE   RBC / HPF 0-5 0 - 5 RBC/hpf   WBC, UA 0-5 0 - 5 WBC/hpf   Bacteria, UA NONE SEEN NONE SEEN   Squamous Epithelial / HPF 0-5 0 - 5 /HPF   Mucus PRESENT     Comment: Performed at [facility], [place] [address], [place], [place] [address]  Comprehensive metabolic panel     Status: Abnormal   Collection Time: 05/19/22  9:07 PM  Result Value Ref Range   Sodium 137 135 - 145 mmol/L   Potassium 3.6 3.5 - 5.1 mmol/L   Chloride 102 98 - 111 mmol/L   CO2 23 22 - 32 mmol/L   Glucose, Bld 140 (H) 70 - 99 mg/dL    Comment: Glucose reference range applies only to samples taken after fasting for at least 8 hours.   BUN 16 6 - 20 mg/dL   Creatinine, Ser 0.96 0.61 - 1.24 mg/dL   Calcium 9.7 8.9 - 10.3 mg/dL   Total Protein 8.4 (H) 6.5 - 8.1 g/dL   Albumin 5.1 (H)  3.5 - 5.0 g/dL   AST 18 15 - 41 U/L   ALT 27 0 - 44 U/L   Alkaline Phosphatase 39 38 - 126 U/L   Total Bilirubin 1.1 0.3 - 1.2 mg/dL   GFR, Estimated >60 >60 mL/min    Comment: (NOTE) Calculated using the CKD-EPI Creatinine Equation (2021)    Anion gap 12 5 - 15    Comment: Performed at [facility], [place] [address] [place], [place] [address]  Lipase, blood     Status: None   Collection Time: 05/19/22  9:07 PM  Result Value Ref Range   Lipase 26 11 - 51 U/L    Comment: Performed at [facility], [place] [address] [place], [place] [address]  CBC with Diff     Status: Abnormal   Collection Time: 05/19/22  9:07 PM  Result Value Ref Range   WBC 13.5 (H) 4.0 - 10.5 K/uL   RBC 5.34 4.22 - 5.81 MIL/uL   Hemoglobin 16.0 13.0 - 17.0 g/dL   HCT 46.5 39.0 - 52.0 %   MCV 87.1 80.0 - 100.0 fL   MCH 30.0 26.0 - 34.0 pg   MCHC 34.4 30.0 - 36.0 g/dL   RDW 12.8 11.5 - 15.5 %   Platelets 272  150 - 400 K/uL   nRBC 0.0 0.0 - 0.2 %   Neutrophils Relative % 85 %   Neutro Abs 11.5 (H) 1.7 - 7.7 K/uL   Lymphocytes Relative 10 %   Lymphs Abs 1.3 0.7 - 4.0 K/uL   Monocytes Relative 5 %   Monocytes Absolute 0.7 0.1 - 1.0 K/uL   Eosinophils Relative 0 %   Eosinophils Absolute 0.0 0.0 - 0.5 K/uL   Basophils Relative 0 %   Basophils Absolute 0.0 0.0 - 0.1 K/uL   Immature Granulocytes 0 %   Abs Immature Granulocytes 0.03 0.00 - 0.07 K/uL    Comment: Performed at [facility], [place] [address] [place], [place] [address]   CT ABDOMEN PELVIS W CONTRAST  Result Date: 05/20/2022 CLINICAL DATA:  Left lower quadrant abdominal pain. EXAM: CT ABDOMEN AND PELVIS WITH CONTRAST TECHNIQUE: Multidetector CT imaging of the abdomen and pelvis was performed using the standard protocol following bolus administration of intravenous contrast. RADIATION DOSE REDUCTION: This exam was performed according to the departmental dose-optimization program which includes automated exposure control, adjustment of the mA and/or kV according to patient size and/or use of iterative reconstruction technique. CONTRAST:  160mL OMNIPAQUE IOHEXOL 300 MG/ML  SOLN COMPARISON:  None Available. FINDINGS: Lower chest: The visualized lung bases are clear. No intra-abdominal free air or free fluid. Hepatobiliary: No focal liver abnormality is seen. No gallstones, gallbladder wall thickening, or biliary dilatation. Pancreas: Unremarkable. No pancreatic ductal dilatation or surrounding inflammatory changes. Spleen: Normal in size without focal abnormality. Adrenals/Urinary Tract: The adrenal glands are unremarkable. The kidneys, visualized ureters, and urinary bladder appear unremarkable. Stomach/Bowel: There is no bowel obstruction. The appendix is enlarged and inflamed measuring 12 mm in thickness. The appendix is located in the right lower quadrant medial to the cecum and anterior to the right psoas muscle. There is a 9 mm  fecalith in the cecum adjacent to the base of the appendix. No drainable fluid collection/abscess. No evidence of perforation. Vascular/Lymphatic: The abdominal aorta and IVC unremarkable. No portal venous gas. There is no adenopathy. Reproductive: The prostate and seminal vesicles are grossly unremarkable. No pelvic mass. Other: None Musculoskeletal: No acute or significant osseous  findings. IMPRESSION: Acute appendicitis. No abscess or perforation. Electronically Signed   By: Anner Crete M.D.   On: 05/20/2022 00:29    Review of Systems  Constitutional:  Positive for chills. Negative for fever.  HENT:  Negative for ear discharge, hearing loss and sore throat.   Eyes:  Negative for discharge.  Respiratory:  Negative for cough and shortness of breath.   Cardiovascular:  Negative for chest pain and leg swelling.  Gastrointestinal:  Positive for abdominal pain, nausea and vomiting. Negative for constipation and diarrhea.  Musculoskeletal:  Negative for myalgias and neck pain.  Skin:  Negative for rash.  Allergic/Immunologic: Negative for environmental allergies.  Neurological:  Negative for dizziness and seizures.  Hematological:  Does not bruise/bleed easily.  Psychiatric/Behavioral:  Negative for suicidal ideas.   All other systems reviewed and are negative.   Blood pressure (!) 96/59, pulse (!) 57, temperature 97.8 F (36.6 C), temperature source Oral, resp. rate 20, SpO2 95 %. Physical Exam Constitutional:      Appearance: He is well-developed.     Comments: Conversant No acute distress  HENT:     Head: Normocephalic and atraumatic.  Eyes:     General: Lids are normal. No scleral icterus.    Pupils: Pupils are equal, round, and reactive to light.     Comments: Pupils are equal round and reactive No lid lag Moist conjunctiva  Neck:     Thyroid: No thyromegaly.     Trachea: No tracheal tenderness.     Comments: No cervical lymphadenopathy Cardiovascular:     Rate and Rhythm:  Normal rate and regular rhythm.     Heart sounds: No murmur heard. Pulmonary:     Effort: Pulmonary effort is normal.     Breath sounds: Normal breath sounds. No wheezing or rales.  Abdominal:     Tenderness: There is abdominal tenderness in the right lower quadrant.     Hernia: No hernia is present.  Musculoskeletal:     Cervical back: Normal range of motion and neck supple.  Skin:    General: Skin is warm.     Findings: No rash.     Nails: There is no clubbing.     Comments: Normal skin turgor  Neurological:     Mental Status: He is alert and oriented to person, place, and time.     Comments: Normal gait and station  Psychiatric:        Mood and Affect: Mood normal.        Thought Content: Thought content normal.        Judgment: Judgment normal.     Comments: Appropriate affect      Assessment/Plan 81M with acute appendicitis. To OR for lap appy I discussed with the patient the risks benefits of the procedure to include but not limited to: Infection, bleeding, damage to surrounding structures, possible ileus, possible postoperative infection. Patient voiced understanding and wishes to proceed.   Ralene Ok, MD 05/20/2022, 7:54 AM

## 2022-05-20 NOTE — Discharge Instructions (Signed)
[facility] ______[facility] LAPAROSCOPIC SURGERY: POST OP INSTRUCTIONS Always review your discharge instruction sheet given to you by the facility where your surgery was performed. IF YOU HAVE DISABILITY OR FAMILY LEAVE FORMS, YOU MUST BRING THEM TO THE OFFICE FOR PROCESSING.   DO NOT GIVE THEM TO YOUR DOCTOR.  A prescription for pain medication may be given to you upon discharge.  Take your pain medication as prescribed, if needed.  If narcotic pain medicine is not needed, then you may take acetaminophen (Tylenol) or ibuprofen (Advil) as needed. Take your usually prescribed medications unless otherwise directed. If you need a refill on your pain medication, please contact your pharmacy.  They will contact our office to request authorization. Prescriptions will not be filled after 5pm or on week-ends. You should follow a light diet the first few days after arrival home, such as soup and crackers, etc.  Be sure to include lots of fluids daily. Most patients will experience some swelling and bruising in the area of the incisions.  Ice packs will help.  Swelling and bruising can take several days to resolve.  It is common to experience some constipation if taking pain medication after surgery.  Increasing fluid intake and taking a stool softener (such as Colace) will usually help or prevent this problem from occurring.  A mild laxative (Milk of Magnesia or Miralax) should be taken according to package instructions if there are no bowel movements after 48 hours. Unless discharge instructions indicate otherwise, you may remove your bandages 24-48 hours after surgery, and you may shower at that time.  You may have steri-strips (small skin tapes) in place directly over the incision.  These strips should be left on the skin for 7-10 days.  If your surgeon used skin glue on the incision, you may shower in 24 hours.  The glue will flake off over the next 2-3 weeks.  Any sutures or staples will be  removed at the office during your follow-up visit. ACTIVITIES:  You may resume regular (light) daily activities beginning the next day--such as daily self-care, walking, climbing stairs--gradually increasing activities as tolerated.  You may have sexual intercourse when it is comfortable.  Refrain from any heavy lifting or straining until approved by your doctor. You may drive when you are no longer taking prescription pain medication, you can comfortably wear a seatbelt, and you can safely maneuver your car and apply brakes. RETURN TO WORK:  _1 week_________________________________________________________ Dennis Bast should see your doctor in the office for a follow-up appointment approximately 2-3 weeks after your surgery.  Make sure that you call for this appointment within a day or two after you arrive home to insure a convenient appointment time.  WHEN TO CALL YOUR DOCTOR: Fever over 101.0 Inability to urinate Continued bleeding from incision. Increased pain, redness, or drainage from the incision. Increasing abdominal pain  The clinic staff is available to answer your questions during regular business hours.  Please don't hesitate to call and ask to speak to one of the nurses for clinical concerns.  If you have a medical emergency, go to the nearest emergency room or call 911.  A surgeon from [facility] is always on call at the hospital. [address] [place], [place], [place]  [address] ? [address], [place], [place]   [id number] [phone] ? [phone] ? FAX [phone] Web site: [email or web address]

## 2022-05-20 NOTE — Transfer of Care (Signed)
Immediate Anesthesia Transfer of Care Note  Patient: Jeremy Nielsen  Procedure(s) Performed: APPENDECTOMY LAPAROSCOPIC (Abdomen)  Patient Location: PACU  Anesthesia Type:General  Level of Consciousness: awake and patient cooperative  Airway & Oxygen Therapy: Patient Spontanous Breathing and Patient connected to face mask  Post-op Assessment: Report given to RN and Post -op Vital signs reviewed and stable  Post vital signs: Reviewed and stable  Last Vitals:  Vitals Value Taken Time  BP 113/86 05/20/22 1322  Temp    Pulse 67 05/20/22 1324  Resp 20 05/20/22 1324  SpO2 99 % 05/20/22 1324  Vitals shown include unvalidated device data.  Last Pain:  Vitals:   05/20/22 1037  TempSrc: Oral  PainSc:          Complications: No notable events documented.

## 2022-05-20 NOTE — Anesthesia Postprocedure Evaluation (Signed)
Anesthesia Post Note  Patient: Jeremy Nielsen  Procedure(s) Performed: APPENDECTOMY LAPAROSCOPIC (Abdomen)     Patient location during evaluation: PACU Anesthesia Type: General Level of consciousness: awake Pain management: pain level controlled Vital Signs Assessment: post-procedure vital signs reviewed and stable Respiratory status: spontaneous breathing Cardiovascular status: stable Postop Assessment: no apparent nausea or vomiting Anesthetic complications: no  No notable events documented.  Last Vitals:  Vitals:   05/20/22 1345 05/20/22 1400  BP: 107/67 (!) 133/99  Pulse: 72 (!) 54  Resp: 17 16  Temp:  (!) 36.2 C  SpO2: 98% 95%    Last Pain:  Vitals:   05/20/22 1400  TempSrc:   PainSc: 0-No pain                 John F Salome Arnt

## 2022-05-20 NOTE — Anesthesia Procedure Notes (Signed)
Procedure Name: Intubation Date/Time: 05/20/2022 12:20 PM  Performed by: Claudia Desanctis, CRNAPre-anesthesia Checklist: Patient identified, Emergency Drugs available, Suction available and Patient being monitored Patient Re-evaluated:Patient Re-evaluated prior to induction Oxygen Delivery Method: Circle system utilized Preoxygenation: Pre-oxygenation with 100% oxygen Induction Type: IV induction Ventilation: Mask ventilation without difficulty Laryngoscope Size: 2 and Miller Grade View: Grade I Tube type: Oral Tube size: 7.5 mm Number of attempts: 1 Airway Equipment and Method: Stylet Placement Confirmation: ETT inserted through vocal cords under direct vision, positive ETCO2 and breath sounds checked- equal and bilateral Secured at: 22 cm Tube secured with: Tape Dental Injury: Teeth and Oropharynx as per pre-operative assessment

## 2022-05-20 NOTE — Interval H&P Note (Signed)
History and Physical Interval Note:  05/20/2022 11:42 AM  Jeremy Nielsen  has presented today for surgery, with the diagnosis of appendicitis.  The various methods of treatment have been discussed with the patient and family. After consideration of risks, benefits and other options for treatment, the patient has consented to  Procedure(s): APPENDECTOMY LAPAROSCOPIC (N/A) as a surgical intervention.  The patient's history has been reviewed, patient examined, no change in status, stable for surgery.  I have reviewed the patient's chart and labs.  Questions were answered to the patient's satisfaction.     Lindwood Mogel Rich Brave

## 2022-05-20 NOTE — ED Provider Notes (Signed)
Nursing notes and vitals signs, including pulse oximetry, reviewed.  Summary of this visit's results, reviewed by myself:  EKG:  EKG Interpretation  Date/Time:    Ventricular Rate:    PR Interval:    QRS Duration:   QT Interval:    QTC Calculation:   R Axis:     Text Interpretation:          Labs:  Results for orders placed or performed during the hospital encounter of 05/19/22 (from the past 24 hour(s))  Urinalysis, Routine w reflex microscopic -Urine, Clean Catch     Status: Abnormal   Collection Time: 05/19/22  8:54 PM  Result Value Ref Range   Color, Urine YELLOW YELLOW   APPearance CLOUDY (A) CLEAR   Specific Gravity, Urine 1.020 1.005 - 1.030   pH 8.0 5.0 - 8.0   Glucose, UA NEGATIVE NEGATIVE mg/dL   Hgb urine dipstick NEGATIVE NEGATIVE   Bilirubin Urine NEGATIVE NEGATIVE   Ketones, ur 80 (A) NEGATIVE mg/dL   Protein, ur 30 (A) NEGATIVE mg/dL   Nitrite NEGATIVE NEGATIVE   Leukocytes,Ua NEGATIVE NEGATIVE   RBC / HPF 0-5 0 - 5 RBC/hpf   WBC, UA 0-5 0 - 5 WBC/hpf   Bacteria, UA NONE SEEN NONE SEEN   Squamous Epithelial / HPF 0-5 0 - 5 /HPF   Mucus PRESENT   Comprehensive metabolic panel     Status: Abnormal   Collection Time: 05/19/22  9:07 PM  Result Value Ref Range   Sodium 137 135 - 145 mmol/L   Potassium 3.6 3.5 - 5.1 mmol/L   Chloride 102 98 - 111 mmol/L   CO2 23 22 - 32 mmol/L   Glucose, Bld 140 (H) 70 - 99 mg/dL   BUN 16 6 - 20 mg/dL   Creatinine, Ser 0.96 0.61 - 1.24 mg/dL   Calcium 9.7 8.9 - 10.3 mg/dL   Total Protein 8.4 (H) 6.5 - 8.1 g/dL   Albumin 5.1 (H) 3.5 - 5.0 g/dL   AST 18 15 - 41 U/L   ALT 27 0 - 44 U/L   Alkaline Phosphatase 39 38 - 126 U/L   Total Bilirubin 1.1 0.3 - 1.2 mg/dL   GFR, Estimated >60 >60 mL/min   Anion gap 12 5 - 15  Lipase, blood     Status: None   Collection Time: 05/19/22  9:07 PM  Result Value Ref Range   Lipase 26 11 - 51 U/L  CBC with Diff     Status: Abnormal   Collection Time: 05/19/22  9:07 PM  Result  Value Ref Range   WBC 13.5 (H) 4.0 - 10.5 K/uL   RBC 5.34 4.22 - 5.81 MIL/uL   Hemoglobin 16.0 13.0 - 17.0 g/dL   HCT 46.5 39.0 - 52.0 %   MCV 87.1 80.0 - 100.0 fL   MCH 30.0 26.0 - 34.0 pg   MCHC 34.4 30.0 - 36.0 g/dL   RDW 12.8 11.5 - 15.5 %   Platelets 272 150 - 400 K/uL   nRBC 0.0 0.0 - 0.2 %   Neutrophils Relative % 85 %   Neutro Abs 11.5 (H) 1.7 - 7.7 K/uL   Lymphocytes Relative 10 %   Lymphs Abs 1.3 0.7 - 4.0 K/uL   Monocytes Relative 5 %   Monocytes Absolute 0.7 0.1 - 1.0 K/uL   Eosinophils Relative 0 %   Eosinophils Absolute 0.0 0.0 - 0.5 K/uL   Basophils Relative 0 %   Basophils Absolute 0.0 0.0 -  0.1 K/uL   Immature Granulocytes 0 %   Abs Immature Granulocytes 0.03 0.00 - 0.07 K/uL    Imaging Studies: CT ABDOMEN PELVIS W CONTRAST  Result Date: 05/20/2022 CLINICAL DATA:  Left lower quadrant abdominal pain. EXAM: CT ABDOMEN AND PELVIS WITH CONTRAST TECHNIQUE: Multidetector CT imaging of the abdomen and pelvis was performed using the standard protocol following bolus administration of intravenous contrast. RADIATION DOSE REDUCTION: This exam was performed according to the departmental dose-optimization program which includes automated exposure control, adjustment of the mA and/or kV according to patient size and/or use of iterative reconstruction technique. CONTRAST:  119mL OMNIPAQUE IOHEXOL 300 MG/ML  SOLN COMPARISON:  None Available. FINDINGS: Lower chest: The visualized lung bases are clear. No intra-abdominal free air or free fluid. Hepatobiliary: No focal liver abnormality is seen. No gallstones, gallbladder wall thickening, or biliary dilatation. Pancreas: Unremarkable. No pancreatic ductal dilatation or surrounding inflammatory changes. Spleen: Normal in size without focal abnormality. Adrenals/Urinary Tract: The adrenal glands are unremarkable. The kidneys, visualized ureters, and urinary bladder appear unremarkable. Stomach/Bowel: There is no bowel obstruction. The  appendix is enlarged and inflamed measuring 12 mm in thickness. The appendix is located in the right lower quadrant medial to the cecum and anterior to the right psoas muscle. There is a 9 mm fecalith in the cecum adjacent to the base of the appendix. No drainable fluid collection/abscess. No evidence of perforation. Vascular/Lymphatic: The abdominal aorta and IVC unremarkable. No portal venous gas. There is no adenopathy. Reproductive: The prostate and seminal vesicles are grossly unremarkable. No pelvic mass. Other: None Musculoskeletal: No acute or significant osseous findings. IMPRESSION: Acute appendicitis. No abscess or perforation. Electronically Signed   By: Anner Crete M.D.   On: 05/20/2022 00:29    1:40 AM Rocephin and Flagyl ordered for acute appendicitis.  Will consult general surgery.  1:56 AM Dr. Rosendo Gros to admit to surgery service with anticipation of appendectomy later today.     Gayna Braddy, Jenny Reichmann, MD 05/20/22 [phone]

## 2022-05-21 ENCOUNTER — Encounter (HOSPITAL_COMMUNITY): Payer: Self-pay | Admitting: Surgery

## 2022-05-22 NOTE — Discharge Summary (Signed)
Physician Discharge Summary  Patient ID: Esco Oshman MRN: [id number] DOB/AGE: 1975/12/16 47 y.o.  Admit date: 05/19/2022 Discharge date: 05/22/2022  Admission Diagnoses: appendicitis  Discharge Diagnoses:  Principal Problem:   Acute appendicitis  Discharged Condition: good  Hospital Course: lap appy    Allergies as of 05/20/2022   No Known Allergies      Medication List     STOP taking these medications    rivaroxaban 20 MG Tabs tablet Commonly known as: Xarelto   Rivaroxaban Stater Pack (15 mg and 20 mg) Commonly known as: XARELTO STARTER PACK   Xarelto Starter Pack Generic drug: Rivaroxaban Stater Pack (15 mg and 20 mg)       TAKE these medications    docusate sodium 100 MG capsule Commonly known as: Colace Take 1 capsule (100 mg total) by mouth 2 (two) times daily. Okay to decrease to once daily or stop taking if having loose bowel movements.   traMADol 50 MG tablet Commonly known as: Ultram Take 1 tablet (50 mg total) by mouth every 6 (six) hours as needed for up to 5 days (pain not controlled with tylenol, ibuprofen, rest, ice etc.).        Follow-up Information     Maczis, Carlena Hurl, [place]. Schedule an appointment as soon as possible for a visit in 3 week(s).   Specialty: General Surgery Contact information: [place] [address] [phone]                 Signed: Clovis Riley 05/22/2022, 9:02 AM

## 2022-05-23 LAB — SURGICAL PATHOLOGY
